# Patient Record
Sex: Female | Born: 1955 | Race: White | Hispanic: No | Marital: Married | Smoking: Current every day smoker
Health system: Southern US, Community
[De-identification: ages and names within clinical notes are randomized; demographics above are authoritative.]

## PROBLEM LIST (undated history)

## (undated) DIAGNOSIS — J449 Chronic obstructive pulmonary disease, unspecified: Secondary | ICD-10-CM

## (undated) DIAGNOSIS — IMO0001 Reserved for inherently not codable concepts without codable children: Secondary | ICD-10-CM

## (undated) DIAGNOSIS — C801 Malignant (primary) neoplasm, unspecified: Secondary | ICD-10-CM

## (undated) DIAGNOSIS — J189 Pneumonia, unspecified organism: Secondary | ICD-10-CM

## (undated) HISTORY — PX: TUBAL LIGATION: SHX77

---

## 2005-09-08 ENCOUNTER — Emergency Department: Payer: Self-pay | Admitting: General Practice

## 2014-04-26 HISTORY — PX: LUNG BIOPSY: SHX232

## 2014-08-21 ENCOUNTER — Ambulatory Visit
Admit: 2014-08-21 | Disposition: A | Discharge status: Home / Self Care | Payer: Self-pay | Attending: Internal Medicine | Admitting: Internal Medicine

## 2014-08-22 ENCOUNTER — Inpatient Hospital Stay
Admission: AD | Admit: 2014-08-22 | Discharge: 2014-08-25 | DRG: 871 | Disposition: A | Discharge status: Home / Self Care | Payer: Self-pay | Attending: Internal Medicine | Admitting: Internal Medicine

## 2014-08-22 DIAGNOSIS — F17200 Nicotine dependence, unspecified, uncomplicated: Secondary | ICD-10-CM | POA: Diagnosis present

## 2014-08-22 DIAGNOSIS — E871 Hypo-osmolality and hyponatremia: Secondary | ICD-10-CM | POA: Diagnosis not present

## 2014-08-22 DIAGNOSIS — Z803 Family history of malignant neoplasm of breast: Secondary | ICD-10-CM

## 2014-08-22 DIAGNOSIS — A419 Sepsis, unspecified organism: Principal | ICD-10-CM | POA: Diagnosis present

## 2014-08-22 DIAGNOSIS — J151 Pneumonia due to Pseudomonas: Secondary | ICD-10-CM | POA: Diagnosis present

## 2014-08-22 DIAGNOSIS — Z886 Allergy status to analgesic agent status: Secondary | ICD-10-CM

## 2014-08-22 DIAGNOSIS — Z87440 Personal history of urinary (tract) infections: Secondary | ICD-10-CM

## 2014-08-22 DIAGNOSIS — E876 Hypokalemia: Secondary | ICD-10-CM | POA: Diagnosis present

## 2014-08-22 DIAGNOSIS — Z885 Allergy status to narcotic agent status: Secondary | ICD-10-CM

## 2014-08-22 DIAGNOSIS — I248 Other forms of acute ischemic heart disease: Secondary | ICD-10-CM | POA: Diagnosis not present

## 2014-08-22 LAB — CBC WITH DIFFERENTIAL/PLATELET
Basophil #: 0 10*3/uL (ref 0.0–0.1)
Basophil %: 0.1 %
Eosinophil #: 0.1 10*3/uL (ref 0.0–0.7)
Eosinophil %: 0.2 %
HCT: 33.4 % — ABNORMAL LOW (ref 35.0–47.0)
HGB: 11.1 g/dL — ABNORMAL LOW (ref 12.0–16.0)
LYMPHS ABS: 1.6 10*3/uL (ref 1.0–3.6)
Lymphocyte %: 6.4 %
MCH: 30 pg (ref 26.0–34.0)
MCHC: 33.3 g/dL (ref 32.0–36.0)
MCV: 90 fL (ref 80–100)
Monocyte #: 2 x10 3/mm — ABNORMAL HIGH (ref 0.2–0.9)
Monocyte %: 7.7 %
NEUTROS ABS: 22.1 10*3/uL — AB (ref 1.4–6.5)
Neutrophil %: 85.6 %
Platelet: 525 10*3/uL — ABNORMAL HIGH (ref 150–440)
RBC: 3.71 10*6/uL — AB (ref 3.80–5.20)
RDW: 14.6 % — ABNORMAL HIGH (ref 11.5–14.5)
WBC: 25.8 10*3/uL — AB (ref 3.6–11.0)

## 2014-08-22 LAB — URINALYSIS, COMPLETE
BACTERIA: NONE SEEN
BILIRUBIN, UR: NEGATIVE
Blood: NEGATIVE
GLUCOSE, UR: NEGATIVE mg/dL (ref 0–75)
Ketone: NEGATIVE
LEUKOCYTE ESTERASE: NEGATIVE
Nitrite: NEGATIVE
Ph: 6 (ref 4.5–8.0)
Protein: NEGATIVE
Specific Gravity: 1.014 (ref 1.003–1.030)

## 2014-08-22 LAB — RAPID HIV SCREEN (HIV 1/2 AB+AG)

## 2014-08-22 LAB — PROTIME-INR
INR: 1.2
PROTHROMBIN TIME: 15.2 s — AB

## 2014-08-22 LAB — BASIC METABOLIC PANEL
Anion Gap: 11 (ref 7–16)
BUN: 25 mg/dL — ABNORMAL HIGH
CALCIUM: 7.8 mg/dL — AB
CO2: 27 mmol/L
Chloride: 94 mmol/L — ABNORMAL LOW
Creatinine: 0.8 mg/dL
EGFR (African American): >60
EGFR (Non-African Amer.): >60
Glucose: 105 mg/dL — ABNORMAL HIGH
Potassium: 3 mmol/L — ABNORMAL LOW
Sodium: 132 mmol/L — ABNORMAL LOW

## 2014-08-22 LAB — HEPATIC FUNCTION PANEL A (ARMC)
ALBUMIN: 2.3 g/dL — AB
ALK PHOS: 294 U/L — AB
ALT: 48 U/L
BILIRUBIN TOTAL: 0.9 mg/dL
Bilirubin, Direct: 0.4 mg/dL
Indirect Bilirubin: 0.5
SGOT(AST): 48 U/L — ABNORMAL HIGH
Total Protein: 6.4 g/dL — ABNORMAL LOW

## 2014-08-22 LAB — PHOSPHORUS: Phosphorus: 3.3 mg/dL

## 2014-08-22 LAB — TROPONIN I
Troponin-I: 0.05 ng/mL — ABNORMAL HIGH
Troponin-I: 0.07 ng/mL — ABNORMAL HIGH
Troponin-I: 0.05 ng/mL — ABNORMAL HIGH

## 2014-08-22 LAB — MAGNESIUM: MAGNESIUM: 2.3 mg/dL

## 2014-08-22 LAB — LACTIC ACID, PLASMA: Lactic Acid, Venous: 1.3 mmol/L

## 2014-08-23 ENCOUNTER — Other Ambulatory Visit: Payer: Self-pay

## 2014-08-23 LAB — CBC WITH DIFFERENTIAL/PLATELET
BASOS ABS: 0.2 10*3/uL — AB (ref 0.0–0.1)
BASOS PCT: 0.7 %
Eosinophil #: 0 10*3/uL (ref 0.0–0.7)
Eosinophil %: 0.1 %
HCT: 32.3 % — ABNORMAL LOW (ref 35.0–47.0)
HGB: 11 g/dL — ABNORMAL LOW (ref 12.0–16.0)
Lymphocyte #: 1.5 10*3/uL (ref 1.0–3.6)
Lymphocyte %: 6.1 %
MCH: 30.6 pg (ref 26.0–34.0)
MCHC: 34.1 g/dL (ref 32.0–36.0)
MCV: 90 fL (ref 80–100)
MONO ABS: 2.2 x10 3/mm — AB (ref 0.2–0.9)
Monocyte %: 9.1 %
NEUTROS PCT: 84 %
Neutrophil #: 20.3 10*3/uL — ABNORMAL HIGH (ref 1.4–6.5)
PLATELETS: 521 10*3/uL — AB (ref 150–440)
RBC: 3.6 10*6/uL — ABNORMAL LOW (ref 3.80–5.20)
RDW: 14.6 % — AB (ref 11.5–14.5)
WBC: 24.2 10*3/uL — AB (ref 3.6–11.0)

## 2014-08-23 LAB — BASIC METABOLIC PANEL
Anion Gap: 11 (ref 7–16)
BUN: 16 mg/dL
CALCIUM: 7.9 mg/dL — AB
CO2: 27 mmol/L
CREATININE: 0.81 mg/dL
Chloride: 95 mmol/L — ABNORMAL LOW
EGFR (African American): >60
EGFR (Non-African Amer.): >60
Glucose: 101 mg/dL — ABNORMAL HIGH
POTASSIUM: 3.6 mmol/L
Sodium: 133 mmol/L — ABNORMAL LOW

## 2014-08-23 LAB — CULTURE, BLOOD (SINGLE)

## 2014-08-24 ENCOUNTER — Other Ambulatory Visit: Payer: Self-pay

## 2014-08-24 DIAGNOSIS — A419 Sepsis, unspecified organism: Secondary | ICD-10-CM | POA: Diagnosis present

## 2014-08-24 LAB — CBC WITH DIFFERENTIAL/PLATELET
BASOS ABS: 0.1 10*3/uL (ref 0.0–0.1)
Basophil %: 0.4 %
EOS ABS: 0.1 10*3/uL (ref 0.0–0.7)
Eosinophil %: 0.2 %
HCT: 28 % — ABNORMAL LOW (ref 35.0–47.0)
HGB: 9.7 g/dL — AB (ref 12.0–16.0)
Lymphocyte #: 1.6 10*3/uL (ref 1.0–3.6)
Lymphocyte %: 6.4 %
MCH: 31.2 pg (ref 26.0–34.0)
MCHC: 34.7 g/dL (ref 32.0–36.0)
MCV: 90 fL (ref 80–100)
MONO ABS: 1.9 x10 3/mm — AB (ref 0.2–0.9)
Monocyte %: 7.7 %
NEUTROS ABS: 20.8 10*3/uL — AB (ref 1.4–6.5)
Neutrophil %: 85.3 %
Platelet: 513 10*3/uL — ABNORMAL HIGH (ref 150–440)
RBC: 3.11 10*6/uL — ABNORMAL LOW (ref 3.80–5.20)
RDW: 14.7 % — AB (ref 11.5–14.5)
WBC: 24.4 10*3/uL — ABNORMAL HIGH (ref 3.6–11.0)

## 2014-08-24 LAB — URINE CULTURE

## 2014-08-24 MED ORDER — SODIUM CHLORIDE 0.9 % IJ SOLN: 3.0000 mL | INTRAMUSCULAR | Status: DC | PRN

## 2014-08-24 MED ORDER — ACETAMINOPHEN 325 MG PO TABS
650.0000 mg | ORAL_TABLET | ORAL | Status: DC | PRN
  Administered 2014-08-25 (×2): 650 mg via ORAL
  Filled 2014-08-24: qty 2

## 2014-08-24 MED ORDER — OXYMETAZOLINE HCL 0.05 % NA SOLN
1.0000 | Freq: Two times a day (BID) | NASAL | Status: DC
  Administered 2014-08-25: 1 via NASAL
  Filled 2014-08-24: qty 15

## 2014-08-24 MED ORDER — IPRATROPIUM-ALBUTEROL 20-100 MCG/ACT IN AERS
1.0000 | INHALATION_SPRAY | Freq: Four times a day (QID) | RESPIRATORY_TRACT | Status: DC
  Administered 2014-08-25 (×2): 1 via RESPIRATORY_TRACT

## 2014-08-24 MED ORDER — VANCOMYCIN HCL IN DEXTROSE 750-5 MG/150ML-% IV SOLN
750.0000 mg | INTRAVENOUS | Status: DC
  Filled 2014-08-24: qty 150

## 2014-08-24 MED ORDER — ONDANSETRON HCL 4 MG/2ML IJ SOLN: 4.0000 mg | INTRAMUSCULAR | Status: DC | PRN

## 2014-08-24 MED ORDER — LEVOFLOXACIN IN D5W 750 MG/150ML IV SOLN: 750.0000 mg | INTRAVENOUS | Status: DC

## 2014-08-24 MED ORDER — DOCUSATE SODIUM 100 MG PO CAPS: 100.0000 mg | ORAL_CAPSULE | Freq: Two times a day (BID) | ORAL | Status: DC | PRN

## 2014-08-24 MED ORDER — SENNA 8.6 MG PO TABS: 1.0000 | ORAL_TABLET | Freq: Two times a day (BID) | ORAL | Status: DC | PRN

## 2014-08-24 MED ORDER — PIPERACILLIN-TAZOBACTAM 4.5 G IVPB
4.5000 g | Freq: Three times a day (TID) | INTRAVENOUS | Status: DC
  Administered 2014-08-25: 06:00:00 4.5 g via INTRAVENOUS
  Filled 2014-08-24 (×3): qty 100

## 2014-08-25 DIAGNOSIS — J151 Pneumonia due to Pseudomonas: Secondary | ICD-10-CM

## 2014-08-25 LAB — CBC WITH DIFFERENTIAL/PLATELET
BASOS PCT: 0 %
Basophils Absolute: 0 10*3/uL (ref 0–0.1)
EOS PCT: 0 %
Eosinophils Absolute: 0 10*3/uL (ref 0–0.7)
HEMATOCRIT: 27.5 % — AB (ref 35.0–47.0)
Hemoglobin: 9.5 g/dL — ABNORMAL LOW (ref 12.0–16.0)
Lymphocytes Relative: 5 %
Lymphs Abs: 1.4 10*3/uL (ref 1.0–3.6)
MCH: 31.1 pg (ref 26.0–34.0)
MCHC: 34.4 g/dL (ref 32.0–36.0)
MCV: 90.5 fL (ref 80.0–100.0)
MONO ABS: 2.9 10*3/uL — AB (ref 0.2–0.9)
Monocytes Relative: 10 %
NEUTROS ABS: 24.4 10*3/uL — AB (ref 1.4–6.5)
Neutrophils Relative %: 85 %
Platelets: 565 10*3/uL — ABNORMAL HIGH (ref 150–440)
RBC: 3.04 MIL/uL — ABNORMAL LOW (ref 3.80–5.20)
RDW: 14.5 % (ref 11.5–14.5)
WBC: 28.7 10*3/uL — ABNORMAL HIGH (ref 3.6–11.0)

## 2014-08-25 LAB — BASIC METABOLIC PANEL
Anion gap: 8 (ref 5–15)
BUN: 14 mg/dL (ref 6–20)
CALCIUM: 7.7 mg/dL — AB (ref 8.9–10.3)
CO2: 25 mmol/L (ref 22–32)
Chloride: 97 mmol/L — ABNORMAL LOW (ref 101–111)
Creatinine, Ser: 0.77 mg/dL (ref 0.44–1.00)
GFR calc Af Amer: >60 mL/min (ref >60)
GFR calc non Af Amer: >60 mL/min (ref >60)
GLUCOSE: 106 mg/dL — AB (ref 65–99)
Potassium: 4.1 mmol/L (ref 3.5–5.1)
SODIUM: 130 mmol/L — AB (ref 135–145)

## 2014-08-25 LAB — EXPECTORATED SPUTUM ASSESSMENT W REFEX TO RESP CULTURE

## 2014-08-25 MED ORDER — LEVOFLOXACIN 500 MG PO TABS
750.0000 mg | ORAL_TABLET | Freq: Every day | ORAL | Status: AC
Start: 2014-08-25 — End: 2014-09-04

## 2014-08-25 NOTE — Consult Note (Addendum)
PATIENT NAME:  Kelly Curry, Kelly Curry MR#:  [id number] DATE OF BIRTH:  09-05-1955  DATE OF CONSULTATION:  08/23/2014  REFERRING PHYSICIAN:   CONSULTING PHYSICIAN:  Allyne Gee, MD  REASON FOR CONSULTATION: Cavitary pneumonia.   HISTORY OF PRESENT ILLNESS: A 59 year old female who is a patient of Dr. Clayborn Bigness, came into the office about a week or so ago when she developed increasing cough and congestion. She was having fevers and chills, thought to have influenza. She did not seek attention until Monday of this week. She had been having fevers about every 4-6 hours, took  Tylenol to help. Then she started having a cough, was not really producing enough sputum. She had her urine checked and that appeared to show that there was a UTI and she was started on antibiotics. The patient states that she has not traveled abroad. She never leaves [place]. She states that she has been a smoker and has smoked about a pack and a half day. The patient has had no previous history of cancer.   PAST MEDICAL HISTORY: Unremarkable.   PAST SURGICAL HISTORY: Unremarkable.   SOCIAL HISTORY: As already mentioned, positive for tobacco use, a pack and a half day. She does also drink alcohol. She works at [employer].    ALLERGIES: HYDROCODONE, MOTRIN, CODEINE AND ADVIL.   MEDICATIONS: Reviewed on the electronic medical record.   REVIEW OF SYSTEMS: Complete 12-point review of system is performed and was unremarkable other than the HPI.   PHYSICAL EXAMINATION: VITAL SIGNS: At the time the patient was seen, temperature 97.9, pulse 95, respiratory rate 18, blood pressure 95/61, saturations 95%.  NECK: Appeared to be supple. There was no JVD. No adenopathy. No thyromegaly.  CHEST: Coarse breath sounds but diminished.  CARDIOVASCULAR: S1, S2 normal. Regular rhythm. No gallop or rub.  ABDOMEN: Soft and nontender.  NEUROLOGIC: The patient was awake and alert, moving all 4 extremities.  ABDOMEN: Soft and appeared to be  benign.  EXTREMITIES: No cyanosis or clubbing. Pulses equal.  SKIN: Without any acute rashes.  MUSCULOSKELETAL: Without any active synovitis.   LABORATORY RESULTS: White count 24.2, hemoglobin 11, hematocrit 32.3. CT scan of the chest shows extensive left upper lobe cavitary pneumonitis with some volume loss. There is also a dense calcified left lower lobe granuloma. There was no centrally obstructing mass noted. The patient's blood gases done on admission were 7.5 pO2, 59.  IMPRESSION: 1.  Cavitary left upper lobe pneumonia.  2.  History of tobacco abuse, probable chronic obstructive pulmonary disease.   PLAN: Currently, she is on antibiotics and I would continue with the current selection as you are. Would also collect sputum x 3 for AFB and also cultures. She is currently going to continue with Zosyn and she also did receive vancomycin. If the x-ray over the course of the next several days does not show any significant improvement, then I would consider doing a bronchoscopy. Risks and benefits were discussed with the patient at this time. Would continue with pulmonary toilet as you are. Smoking cessation counseling also provided.   Thank you for consulting me in the care of this patient. Will make further recommendations as necessary.    ____________________________ Allyne Gee, MD sak:at D: 08/23/2014 12:11:19 ET T: 08/23/2014 12:30:08 ET JOB#: [id number]  cc: Allyne Gee, MD, <Dictator> Allyne Gee MD ELECTRONICALLY SIGNED 09/19/2014 20:36

## 2014-08-25 NOTE — H&P (Signed)
PATIENT NAME:  Kelly Curry, Kelly Curry MR#:  [id number] DATE OF BIRTH:  1955/12/04  DATE OF ADMISSION:  08/22/2014  ADMITTING PHYSICIAN:  Gladstone Lighter, MD PRIMARY CARE PHYSICIAN:  Lavera Guise, MD   CHIEF COMPLAINT:  Flu-like illness.   HISTORY OF PRESENT ILLNESS:  Ms. Wynetta Emery is a 58 year old Caucasian female with no significant past medical history.  Went to Dr. Etta Quill office this Monday for a week history of chills, fevers at home, worsening breathing and also body aches.  She never had a primary care physician before this.  She said her symptoms started about 9 days ago.  She thought it felt like flu and she would get over it; however, they were not improving.  Initially with fevers, chills, chills every 4 to 6 hours and she has been taking Extra Strength Tylenol every 4 to 6 hours.   She was getting body aches and slowly developed into labored breathing with slight dry cough.  Dr. Humphrey Rolls did some blood work on her on Tuesday and then she was asked today to come to the ER because her labs were abnormal.   She had an elevated white count of 22,000 and a possible UTI.  The patient denies any previous exposure to tuberculosis.  She has never worked in a prison or nursing home.  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  No chemical exposure.  Never was diagnosed with any COPD, asthma and never been hospitalized before.  Her chest x-ray here shows a large cavitary lesion, possible pneumonia versus underlying tuberculosis on the left upper lobe and is being admitted for the same.   PAST MEDICAL HISTORY:  No known past medical history.   PAST SURGICAL HISTORY:  Tubal ligation.   SOCIAL HISTORY:  Smokes about 1-1/2 packs per day.  Alcohol use, up to 1 to 2 beers every day.  No recreational drug use.  Steady on her feet.  Works at the [employer].   FAMILY HISTORY:  Mom with breast cancer and dad with COPD.    ALLERGIES TO MEDICATIONS:  CODEINE, HYDROCODONE, MOTRIN AND  ADVIL ALL HER NAUSEA AND VOMITING.   CURRENT HOME MEDICATIONS:  She was not taking any medications prior to seeing Dr. Humphrey Rolls but on Monday, she was started on Levaquin 500 mg p.o. daily.   REVIEW OF SYSTEMS:   CONSTITUTIONAL:  Positive for fever, fatigue, and weakness.  EYES:  No blurred vision, double vision, inflammation or glaucoma.  ENT:  No tinnitus, ear pain, hearing loss, epistaxis, or discharge.   RESPIRATORY:  Positive for minimal cough and dyspnea.  No COPD or hemoptysis.   CARDIOVASCULAR:  No chest pain, orthopnea, edema, arrhythmia, palpitations, or syncope.  GASTROINTESTINAL:  Positive for nausea, vomiting.  No abdominal pain, hematemesis, or melena.  GENITOURINARY:  No dysuria, hematuria, renal calculus, frequency, or incontinence.  ENDOCRINE:  No polyuria, nocturia, thyroid problems, heat or cold intolerance.  HEMATOLOGY:  No anemia, easy bruising or bleeding.  SKIN:  No acne, rash or lesions.  MUSCULOSKELETAL:  No neck, back, shoulder pain, arthritis or gout.  NEUROLOGICAL:  No numbness, weakness, CVA, TIA or seizures.  PSYCHOLOGICAL:  No anxiety, insomnia or depression.   PHYSICAL EXAMINATION:  VITAL SIGNS:  Temperature 98.2 degrees Fahrenheit, pulse 99, respirations 18, blood pressure 142/68, pulse oximetry 98% on room air.  GENERAL:  A thin-built well-nourished female sitting in bed not in any acute distress.  HEENT:  Normocephalic, atraumatic.  Pupils equal, round, reacting to light.  Anicteric  sclerae. Extraocular movements intact.  Oropharynx is clear without erythema, mass, or exudates.  NECK:  Supple. No thyromegaly, JVD or carotid bruits.  No lymphadenopathy.  LUNGS:  Moving air bilaterally.  Decreased left upper lobe breath sounds posteriorly.  No wheeze.  No rhonchi.  No crackles heard.  No use of accessory muscles for breathing.  CARDIOVASCULAR:  S1, S2, regular rate and rhythm.  No murmurs, rubs, or gallops.  ABDOMEN:  Soft, nontender, nondistended.  No  hepatosplenomegaly.  Normal bowel sounds.  EXTREMITIES:  No pedal edema.  No clubbing or cyanosis, 2+ dorsalis pedis pulses palpable bilaterally.  SKIN:  No acne, rash or lesions.  LYMPHATICS:  No cervical lymphadenopathy.  NEUROLOGIC:  Cranial nerves intact.  No focal motor or sensory deficits.  PSYCHOLOGICAL:  The patient is awake, alert, oriented x3.   LABORATORY DATA:  WBC 25.8, hemoglobin 11.1, hematocrit 33.4, platelet count 525,000. Sodium 132, potassium 3.0, chloride 94, bicarbonate 27, BUN 25, creatinine 0.80, glucose 105, and calcium of 7.8.  Troponin is 0.07.  Urinalysis - negative for any infection. Magnesium 2.3, phosphorus is 3.3.  INR is 1.2  ALT 48, AST 48, alkaline phosphatase 294, total bilirubin 0.4, albumin of 3.3.  Chest x-ray in the ER showing persistent left upper lobe pneumonia, heart size and pulmonary vascularity are normal.  No adenopathy noted.  CT of the chest done with contrast showing extensive left upper lobe cavitary pneumonia with associated volume loss, densely calcified left lower lobe granuloma, could be reactivation tuberculosis or bacterial pneumonia.   No evidence of any centrally obstructing mass lesion to suggest neoplasm noted.  EKG showing normal sinus rhythm.  No acute ST-T wave abnormalities.  Heart rate of 100.    ASSESSMENT AND PLAN:  This is a 59 year old female with no known past medical history other than smoking, went to her primary care physician for a weeklong cough, fevers and chills at home and now chest x-ray noted to have left upper lobe cavitary lesion.  1.  Sepsis, possibly secondary to pneumonia, mild sinus tachycardia with heart rate greater than 100 and also elevated WBC of 25.8.  Blood cultures have been ordered.  Based on the appearance of CT chest, tuberculosis cannot be ruled out though the patient does not have any exposure to tuberculosis.  Will place her on airborne isolation, sputum for acid-fast bacilli x 3, pulmonary consult, start  her on broad-spectrum antibiotics with vancomycin, Zosyn and Levaquin.  Blood cultures have already been ordered and pulmonary has been consulted at this time.  2.  Hypokalemia, being replaced.  3.  Tobacco use disorder.  Counseled against smoking for greater than 3 minutes and the patient refused to be on any nicotine patch at this time.  No known diagnosis of chronic obstructive pulmonary disease.  Will start on Combivent Respimat just for bronchodilatation purposes.    TOTAL TIME SPENT ON ADMISSION:  50 minutes      ____________________________ Gladstone Lighter, MD rk:NT D: 08/22/2014 17:28:19 ET T: 08/22/2014 17:57:58 ET JOB#: [id number]  cc: Gladstone Lighter, MD, <Dictator> Lavera Guise, MD Gladstone Lighter MD ELECTRONICALLY SIGNED 08/23/2014 17:22

## 2014-08-25 NOTE — Plan of Care (Signed)
Problem: Discharge Progression Outcomes Goal: Other Discharge Outcomes/Goals Plan of Care Progress to Goal:  Infection:  IV abx continue. Pt remains afebrile. Airborne isolation continues. Still awaiting last sputum specimen from pt. Specimen cup in room.

## 2014-08-25 NOTE — Plan of Care (Signed)
Problem: Discharge Progression Outcomes Goal: Discharge plan in place and appropriate Individualization of Care Patient goes by [facility].  Lives at home with husband.  Smokes 1.5 ppd. denies other PMH.

## 2014-08-25 NOTE — Progress Notes (Signed)
For prior documentation see [facility] clinical [facility] medical record # [id number]

## 2014-08-25 NOTE — Discharge Summary (Signed)
Kelly Curry, is a 59 y.o. female  DOB 05-23-1955  MRN [id number].  Admission date:  08/22/2014  Admitting Physician  Gladstone Lighter, MD  Discharge Date:  08/25/2014   Primary MD  No primary care provider on file.    Admission Diagnosis  Sepsis   Discharge Diagnosis  Sepsis    Active Problems:   Sepsis   Pneumonia due to Pseudomonas      No past medical history on file.  No past surgical history on file.     History of present illness and  Hospital Course:     Kindly see H&P for history of present illness and admission details, please review complete Labs, Consult reports and Test reports for all details in brief  HPI  from the history and physical done on the day of admission    Hospital Course    1 extensive LUL cavitary lesion: we are r/o TB but could be possibly bacterial PNA She was on isolation and we were able to obtain AFB times 3 ID and pulm consult appreciated Her sputum cx grew out PSEUDOMONAS She will be discharged with Levaquin she was discharged with ABX for 15 days total but may need one more week. She will have follow up with Pulmonary within one week. I did contact the Health Dept to notify them. They will be in contact with patient regarding her AFB results.  2. Elevated WBC from above issues She will need repeat CBC as her WBC are still elevated,but she was afebrile. She was desperate to go home and since she was afebrile and her sputum culture results with sensitivities came back she will be discharged with close follow up. Her blood cx were negative  3. elevated trooponin demand ischemia not ACS  4. hypoK repleted and improved 5. Hyponatremia: from cavitary lesion liklely Na has remained stable but low Due to her tobacco dependence she will need repeat CT scan in 4  weeks   Discharge Condition: stable   Follow UP  Follow-up Information    Follow up with [facility] A, MD. Schedule an appointment as soon as possible for a visit in 1 week.   Specialty:  Internal Medicine   Contact information:   [place] [place] [id number] [phone]         Discharge Instructions  and  Discharge Medications     Discharge Instructions    Call MD for:  temperature >100.4    Complete by:  As directed      Discharge instructions    Complete by:  As directed   Do not return to work until follow up with PULMONARY Resume Regular Diet            Medication List    TAKE these medications        levofloxacin 500 MG tablet  Commonly known as:  LEVAQUIN  Take 1.5 tablets (750 mg total) by mouth daily.          Diet and Activity recommendation: See Discharge Instructions above  Consults obtained - PULMONARY ID   Major procedures and Radiology Reports - PLEASE review detailed and final reports for all details, in brief -      Dg Chest 2 View  08/21/2014   CLINICAL DATA:  Two day history of bladder infection symptoms, possible pneumonia, fever chills for the past week, history of tobacco use.  EXAM: CHEST  2 VIEW  COMPARISON:  None.  FINDINGS: There is a patchy interstitial -cavitary pattern in the left upper lobe. The left lower lobe and the right lung are clear. There is an 31m calcified nodule in the superior segment of the left lower lobe posteriorly.  The heart and pulmonary vascularity are normal. The mediastinum is normal in width. The trachea is midline. There is no pleural effusion. The observed bony thorax is unremarkable.  IMPRESSION: Abnormal appearance of the left upper lobe worrisome for pneumonia, either bacterial or mycobacterial. Malignancy is felt less likely but is not excluded. There is evidence of previous granulomatous infection and thus the findings could indicate reactivation of tuberculosis.  Sputum analysis and  chest CT scanning are recommended.  These results will be called to the ordering clinician or representative by the Radiologist Assistant, and communication documented in the PACS or zVision Dashboard.   Electronically Signed   By: David  JMartiniqueM.D.   On: 08/21/2014 15:39   Ct Chest W Contrast  08/22/2014   CLINICAL DATA:  Nonproductive cough and weakness. Diagnosed with pneumonia yesterday, on antibiotic therapy. Initial encounter.  EXAM: CT CHEST WITH CONTRAST  TECHNIQUE: Multidetector CT imaging of the chest was performed during intravenous contrast administration.  CONTRAST:  75 ml Omnipaque 300.  COMPARISON:  Chest radiographs 08/21/2014 and 08/22/2014.  FINDINGS: Mediastinum/Nodes: There are no enlarged mediastinal, hilar or axillary lymph nodes. Small mediastinal and left hilar lymph nodes are likely reactive. The trachea, esophagus and thyroid gland demonstrate no significant findings. The heart size is normal. There is no pericardial effusion.There is minimal atherosclerosis of the aorta and great vessels.  Lungs/Pleura: There is no pleural effusion. As demonstrated on recent radiographs, there is extensive cavitary airspace disease in the left upper lobe with multiple air-fluid levels measuring up to 3 cm on image 10. There is associated left upper lobe volume loss. No well-defined mass or endobronchial lesion is seen. There is no other airspace disease. There is a densely calcified granuloma in the left lower lobe. The right lung is clear. Moderate emphysematous changes are present.  Upper abdomen:  Unremarkable.  There is no adrenal mass.  Musculoskeletal/Chest wall: There is no chest wall mass or suspicious osseous finding.  IMPRESSION: 1. Extensive left upper lobe cavitary pneumonia with associated volume loss as demonstrated on recent radiographs. There is a densely calcified left lower lobe granuloma. As previously noted, this could reflect reactivation tuberculosis or bacterial pneumonia. 2. No  CT evidence of centrally obstructing mass lesion to suggest neoplasm. Followup PA and lateral chest X-ray is recommended in 3-4 weeks following trial of antibiotic therapy to ensure resolution. 3. Underlying emphysema.   Electronically Signed   By: WRichardean SaleM.D.   On: 08/22/2014 17:15   Dg Chest Port 1 View  08/22/2014   CLINICAL DATA:  Pneumonia with productive cough  EXAM: PORTABLE CHEST - 1 VIEW  COMPARISON:  August 21, 2014  FINDINGS: There is persistent left upper lobe pneumonia with both interstitial and alveolar components in this region. Lungs elsewhere clear. Heart size and pulmonary vascularity are normal. No adenopathy. No bone lesions.  IMPRESSION: Persistent left upper lobe pneumonia. Followup PA and lateral chest X radiographs recommended in 3-4 weeks following trial of antibiotic therapy to ensure resolution and exclude underlying malignancy.   Electronically Signed   By: Lowella Grip III M.D.   On: 08/22/2014 16:40    Micro Results     Recent Results (from the past 240 hour(s))  Urine culture     Status: None   Collection Time: 08/22/14  1:11 PM  Result Value Ref Range Status   Micro Text Report   Final       SOURCE: CLEAN CATCH    COMMENT                   NO GROWTH IN 36 HOURS   ANTIBIOTIC                                                      Culture, blood (single)     Status: None (Preliminary result)   Collection Time: 08/22/14  4:40 PM  Result Value Ref Range Status   Micro Text Report   Preliminary       COMMENT                   NO GROWTH IN 18-24 HOURS   ANTIBIOTIC                                                      Culture, blood (single)     Status: None (Preliminary result)   Collection Time: 08/22/14  4:40 PM  Result Value Ref Range Status   Micro Text Report   Preliminary       COMMENT                   NO GROWTH IN 18-24 HOURS   ANTIBIOTIC                                                      Culture, expectorated sputum-assessment      Status: None   Collection Time: 08/23/14  3:20 AM  Result Value Ref Range Status   Micro Text Report   Final       ORGANISM 1                LIGHT GROWTH PSEUDOMONAS AERUGINOSA   GRAM STAIN                MODERATE WHITE BLOOD CELLS   GRAM STAIN                FEW GRAM NEGATIVE ROD   GRAM STAIN                RARE GRAM POSITIVE COCCI   GRAM STAIN                GOOD SPECIMEN-80-90% WBC   ANTIBIOTIC                    ORG#1  CEFTAZIDIME                   S         CIPROFLOXACIN                 S         GENTAMICIN                    S         IMIPENEM                      S         LEVOFLOXACIN                  S                Today   Subjective:   Kelly Curry today is doing well. She wants to go home. She denies cough or fevers. She denies chest pain Objective:   Blood pressure 131/77, temperature 99.2 F (37.3 C), temperature source Oral, resp. rate 16, height 4' 10.98" (1.498 m), weight 42.14 kg (92 lb 14.4 oz).   Intake/Output Summary (Last 24 hours) at 08/25/14 1313 Last data filed at 08/25/14 7253  Gross per 24 hour  Intake    100 ml  Output      0 ml  Net    100 ml    Exam Awake Alert, Oriented x 3, No new F.N deficits, Normal affect .AT,PERRAL Supple Neck,No JVD, No cervical lymphadenopathy appriciated.  Symmetrical Chest wall movement, Good air movement bilaterally, CTAB RRR,No Gallops,Rubs or new Murmurs, No Parasternal Heave +ve B.Sounds, Abd Soft, Non tender, No organomegaly appriciated, No rebound -guarding or rigidity. No Cyanosis, Clubbing or edema, No new Rash or bruise  Data Review   CBC w Diff: Lab Results  Component Value Date   WBC 28.7* 08/25/2014   WBC 24.4* 08/24/2014   HGB 9.5* 08/25/2014   HGB 9.7* 08/24/2014   HCT 27.5* 08/25/2014   HCT 28.0* 08/24/2014   PLT 565* 08/25/2014   PLT 513* 08/24/2014   LYMPHOPCT 5 08/25/2014   LYMPHOPCT 6.4 08/24/2014   MONOPCT 10 08/25/2014   MONOPCT 7.7 08/24/2014   EOSPCT 0  08/25/2014   EOSPCT 0.2 08/24/2014   BASOPCT 0 08/25/2014   BASOPCT 0.4 08/24/2014    CMP: Lab Results  Component Value Date   NA 130* 08/25/2014   K 4.1 08/25/2014   CL 97* 08/25/2014   CO2 25 08/25/2014   CO2 27 08/23/2014   BUN 14 08/25/2014   BUN 16 08/23/2014   CREATININE 0.77 08/25/2014   CREATININE 0.81 08/23/2014   PROT 6.4* 08/22/2014   ALBUMIN 2.3* 08/22/2014   ALKPHOS 294* 08/22/2014   AST 48* 08/22/2014   ALT 48 08/22/2014  .   Total Time in preparing paper work, data evaluation and todays exam - 35 minutes

## 2014-08-25 NOTE — Consult Note (Addendum)
PATIENT NAME:  Kelly Curry, Kelly Curry MR#:  [id number] DATE OF BIRTH:  [birth year]  DATE OF CONSULTATION:  08/23/2014  REFERRING PHYSICIAN:  Dr. Benjie Karvonen CONSULTING PHYSICIAN:  Cheral Marker. Ola Spurr, MD  REASON FOR CONSULTATION: Cavitary pneumonia.   HISTORY OF PRESENT ILLNESS: A 59 year old female with no significant history except for heavy tobacco abuse who was admitted April 28th with fevers, cough, chills, leukocytosis, and a chest x-ray and CT showing upper lobe cavitary lesions. She really has no significant history, has not seen a doctor in years. She reports this came on about 1 week ago when she started developing chills after coming home from work. She did have some cough. She was eventually seen and had a white count as an outpatient in the 20,000s. She was referred for a CT scan and referred for admission.   The patient reports that in March of this year she did go to a walk-in clinic for some sinus symptoms as well as some mild cough, but was not given any antibiotics had any further work-up done. She was told it was likely allergies. Since then she has not been losing any weight. She does have a chronic cough though which she attributes to smoking. She has no history of TB exposure. She has never been out of the country. He has never been in the [employer] or prison.  PAST MEDICAL HISTORY: None, except for heavy tobacco abuse.   PAST SURGICAL HISTORY: Tubal ligation.   SOCIAL HISTORY: Smokes 1-1/2 packs a day. Drinks 1 to 2 beers a day. No IV drug use. Married and lives with her husband. Works at [employer].   FAMILY HISTORY: Positive for breast cancer in her mother and COPD in her father.   ALLERGIES: CODEINE, HYDROCODONE WITH MOTRIN AND ADVIL, all of which caused nausea and vomiting.   ANTIBIOTICS SINCE ADMISSION: Include Zosyn, vancomycin and azithromycin. As an outpatient, she was found levofloxacin.  REVIEW OF SYSTEMS: Eleven systems reviewed and negative except as per  HPI.  PHYSICAL EXAMINATION: VITAL SIGNS: Temperature 97.9, pulse is 95, blood pressure 95/61, respirations 18, sat 95% on room air, T-max since admission 100.7.  GENERAL: She is quite thin. She is lying in bed in no acute distress, somewhat a poor historian.  HEENT: Pupils reactive. Sclerae are anicteric. Oropharynx is clear with no thrush. She is edentulous.  NECK: Supple. There is no anterior cervical, posterior cervical or supraclavicular lymphadenopathy.  HEART: Regular.  LUNGS: Have very decreased breath sounds bilaterally.  ABDOMEN: Soft, nontender, nondistended.  EXTREMITIES: No clubbing, cyanosis or edema.  SKIN: No lesions.  NEUROLOGIC: She is alert and oriented x3. Grossly nonfocal neuro exam.   DIAGNOSTIC DATA: Laboratory: White blood count April 28th was 25.8, currently 24.2. Hemoglobin 11, platelets 521,000. Diff shows 20.3 neutrophils. INR was normal. Urinalysis showed 5 white cells.   Urine culture negative. Blood cultures negative. Sputum culture April 29th is pending, but gram stain showed moderate white cells, a few gram-negative rods, a few gram-positive cocci.   Renal function is normal; creatinine 0.81.  LFTs had a low albumin at 2.3, alk phos 294, AST 48.   Radiology: Chest x-ray April 27th showed abnormal appearance of the left upper lobe worrisome for pneumonia. There is evidence of previous granulomatous disease.   CT of the chest April 28th showed extensive left upper lobe territory pneumonia with associated volume loss. This is demonstrated on recent radiographs. There is also a calcified granuloma.   Serology: HIV antibody testing is negative.   IMPRESSION: A  59 year old female admitted with approximately 1 week of illness with cough, fevers, chills and leukocytosis. She does have an impressive left upper lobe infiltrate, which is cavitary in nature. She has some granulomas as well. She denies any known tuberculosis risk factors or prior history of tuberculosis.  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 Sputum cultures seem to be mixed with some gram positives and gram negatives.   With the rather sudden onset of this I do suspect it is likely a bacterial acute process; however, she certainly needs to be ruled out for tuberculosis. I do not get the sense that she has had a prolonged illness with weight loss, fevers or night sweats. She does have a chronic cough which she attributes to smoking.   RECOMMENDATIONS: 1.  Continue current antibiotics.  2.  Based on sputum culture can tailor antibiotics once results are available.  3.  Agree with 3 AFB.  4.  Check QuantiFERON Gold.   Thank you for the consult. I will be glad to follow with you.   ____________________________ Cheral Marker. Ola Spurr, MD dpf:sb D: 08/23/2014 14:35:56 ET T: 08/23/2014 15:17:36 ET JOB#: [id number]  cc: Cheral Marker. Ola Spurr, MD, <Dictator> Mackay Hanauer Ola Spurr MD ELECTRONICALLY SIGNED 08/27/2014 10:25

## 2014-08-25 NOTE — Progress Notes (Signed)
Patient discharged per MD order. IV removed and in tact. Prescriptions given to patient. All discharge instructions given and all questions answered. Escorted by nursing and husband.

## 2014-08-25 NOTE — Discharge Instructions (Signed)
Please stay in isolation until we have all three AFB results. The health department will be in contact with you for these results.

## 2014-09-05 ENCOUNTER — Other Ambulatory Visit: Payer: Self-pay | Admitting: Internal Medicine

## 2014-09-05 DIAGNOSIS — J984 Other disorders of lung: Secondary | ICD-10-CM

## 2014-09-06 ENCOUNTER — Ambulatory Visit
Admission: RE | Admit: 2014-09-06 | Discharge: 2014-09-06 | Disposition: A | Discharge status: Home / Self Care | Payer: Self-pay | Source: Ambulatory Visit | Attending: Internal Medicine | Admitting: Internal Medicine

## 2014-09-06 DIAGNOSIS — J479 Bronchiectasis, uncomplicated: Secondary | ICD-10-CM | POA: Insufficient documentation

## 2014-09-06 DIAGNOSIS — J189 Pneumonia, unspecified organism: Secondary | ICD-10-CM | POA: Insufficient documentation

## 2014-09-06 DIAGNOSIS — J984 Other disorders of lung: Secondary | ICD-10-CM

## 2014-09-17 ENCOUNTER — Ambulatory Visit
Admission: RE | Admit: 2014-09-17 | Discharge: 2014-09-17 | Disposition: A | Discharge status: Home / Self Care | Payer: Self-pay | Source: Ambulatory Visit | Attending: Internal Medicine | Admitting: Internal Medicine

## 2014-09-17 ENCOUNTER — Ambulatory Visit: Payer: Self-pay

## 2014-09-17 ENCOUNTER — Encounter
Admission: RE | Disposition: A | Discharge status: Home / Self Care | Payer: Self-pay | Source: Ambulatory Visit | Attending: Internal Medicine

## 2014-09-17 ENCOUNTER — Encounter: Payer: Self-pay | Admitting: *Deleted

## 2014-09-17 DIAGNOSIS — Z7951 Long term (current) use of inhaled steroids: Secondary | ICD-10-CM | POA: Insufficient documentation

## 2014-09-17 DIAGNOSIS — Z9889 Other specified postprocedural states: Secondary | ICD-10-CM

## 2014-09-17 DIAGNOSIS — Z886 Allergy status to analgesic agent status: Secondary | ICD-10-CM | POA: Insufficient documentation

## 2014-09-17 DIAGNOSIS — J984 Other disorders of lung: Secondary | ICD-10-CM

## 2014-09-17 DIAGNOSIS — J42 Unspecified chronic bronchitis: Secondary | ICD-10-CM | POA: Insufficient documentation

## 2014-09-17 DIAGNOSIS — Z885 Allergy status to narcotic agent status: Secondary | ICD-10-CM | POA: Insufficient documentation

## 2014-09-17 DIAGNOSIS — J9811 Atelectasis: Secondary | ICD-10-CM | POA: Insufficient documentation

## 2014-09-17 DIAGNOSIS — F17200 Nicotine dependence, unspecified, uncomplicated: Secondary | ICD-10-CM | POA: Insufficient documentation

## 2014-09-17 HISTORY — DX: Malignant (primary) neoplasm, unspecified: C80.1

## 2014-09-17 HISTORY — DX: Pneumonia, unspecified organism: J18.9

## 2014-09-17 HISTORY — DX: Reserved for inherently not codable concepts without codable children: IMO0001

## 2014-09-17 HISTORY — DX: Chronic obstructive pulmonary disease, unspecified: J44.9

## 2014-09-17 SURGERY — BRONCHOSCOPY, FLEXIBLE
Anesthesia: Moderate Sedation

## 2014-09-17 MED ORDER — SODIUM CHLORIDE 0.9 % IV SOLN
INTRAVENOUS | Status: DC
  Administered 2014-09-17: 12:00:00 via INTRAVENOUS

## 2014-09-17 MED ORDER — MIDAZOLAM HCL 5 MG/5ML IJ SOLN
INTRAMUSCULAR | Status: AC
  Administered 2014-09-17: 3 mg
  Filled 2014-09-17: qty 10

## 2014-09-17 MED ORDER — FENTANYL CITRATE (PF) 100 MCG/2ML IJ SOLN
INTRAMUSCULAR | Status: AC
Start: 2014-09-17 — End: 2014-09-17
  Administered 2014-09-17: 12:00:00
  Administered 2014-09-17: 100 ug
  Filled 2014-09-17: qty 4

## 2014-09-17 MED ORDER — LIDOCAINE HCL 2 % EX GEL
1.0000 "application " | Freq: Once | CUTANEOUS | Status: DC
  Filled 2014-09-17: qty 5

## 2014-09-17 NOTE — Discharge Instructions (Signed)
Flexible Bronchoscopy, Care After °These instructions give you information on caring for yourself after your procedure. Your doctor may also give you more specific instructions. Call your doctor if you have any problems or questions after your procedure. °HOME CARE °· Do not eat or drink anything for 2 hours after your procedure. If you try to eat or drink before the medicine wears off, food or drink could go into your lungs. You could also burn yourself. °· After 2 hours have passed and when you can cough and gag normally, you may eat soft food and drink liquids slowly. °· The day after the test, you may eat your normal diet. °· You may do your normal activities. °· Keep all doctor visits. °GET HELP RIGHT AWAY IF: °· You get more and more short of breath. °· You get light-headed. °· You feel like you are going to pass out (faint). °· You have chest pain. °· You have new problems that worry you. °· You cough up more than a little blood. °· You cough up more blood than before. °MAKE SURE YOU: °· Understand these instructions. °· Will watch your condition. °· Will get help right away if you are not doing well or get worse. °Document Released: 02/07/2009 Document Revised: 04/17/2013 Document Reviewed: 12/15/2012 °ExitCare® Patient Information ©2015 [employer]. This information is not intended to replace advice given to you by your health care provider. Make sure you discuss any questions you have with your health care provider. ° °

## 2014-09-17 NOTE — Op Note (Signed)
[facility] Patient Name: Kelly Curry Procedure Date: 09/17/2014 11:17 AM MRN: [id number] Account #: [id number] Date of Birth: [birth year] Admit Type: Outpatient Age: 59 Room: [facility] PROCEDURE RM 01 Gender: Female Note Status: Finalized Attending MD: Allyne Gee, MD Procedure:         Bronchoscopy Indications:       Atelectasis of the left upper lobe Providers:         Allyne Gee, MD Referring MD:       Medicines:         Midazolam 3 mg mg IV, Fentanyl 448 mcg IV Complications:     No immediate complications Procedure:         Pre-Anesthesia Assessment:                    - A History and Physical has been performed. The patient's                     medications, allergies and sensitivities have been                     reviewed.                    - The risks and benefits of the procedure and the sedation                     options and risks were discussed with the patient. All                     questions were answered and informed consent was obtained.                    - Pre-procedure physical examination revealed no                     contraindications to sedation.                    - ASA Grade Assessment: II - A patient with mild systemic                     disease.                    - After reviewing the risks and benefits, the patient was                     deemed in satisfactory condition to undergo the procedure.                    - The anesthesia plan was to use moderate                     sedation/analgesia.                    - Immediately prior to administration of medications, the                     patient was re-assessed for adequacy to receive sedatives.                    - The heart rate, respiratory rate, oxygen saturations,                     blood pressure, adequacy of pulmonary ventilation, and  response to care were monitored throughout the procedure.                    - The physical status of the  patient was re-assessed after                     the procedure.                    After obtaining informed consent, the bronchoscope was                     passed under direct vision. Throughout the procedure, the                     patient's blood pressure, pulse, and oxygen saturations                     were monitored continuously. the Bronchoscope Olympus                     [id number] was introduced through the right                     nostril and advanced to the tracheobronchial tree of both                     lungs. The procedure was accomplished without difficulty.                     The patient tolerated the procedure well. The total                     duration of the procedure was 20 minutes. Findings:      The nasopharynx/oropharynx appears normal. The larynx appears normal.       The vocal cords are normal and move normally with phonation and       breathing. The subglottic space is normal. The trachea is of normal       caliber. The carina is sharp. The tracheobronchial tree of the right       lung was examined to at least the first subsegmental level. Bronchial       mucosa and anatomy in the right lung are normal; there are no       endobronchial lesions, and no secretions.      Left Lung Abnormalities: Scant secretions were found in the left upper       lobe. They were not obstructing the airway. Erythema was found in the       left upper lobe.      Washings were obtained in the left upper lobe of the lung and sent for       cell count, bacterial culture, viral smears & culture, and fungal & AFB       analysis and cytology. The return was clear.      Verification of patient identification for the specimen was done by the       physician and nurse using the patient's name, birth date and medical       record number.      Transbronchial biopsies were performed in the LUL apical posterior       segments (B1 & B2) of the lung using forceps and sent for  histopathology       examination. The procedure was guided by fluoroscopy. Three biopsy  passes were performed. Three biopsy samples were obtained. Impression:        - The right lung was normal.                    - Scant secretions were found in the left upper lobe.                    - Erythema was present in the left upper lobe.                    - Washings were obtained.                    - Transbronchial lung biopsies were performed. Recommendation:    - Await biopsy, culture, cytology and washing results. Devona Konig, MD Allyne Gee, MD 09/17/2014 12:38:39 PM This report has been signed electronically. Number of Addenda: 0 Note Initiated On: 09/17/2014 11:17 AM      [facility]

## 2014-09-18 LAB — SURGICAL PATHOLOGY

## 2014-09-19 NOTE — H&P (Signed)
Pulmonary Critical Care Medicine    Kelly Curry WNU:[id number] DOB: 1955/11/06 DOA: 09/17/2014  Referring physician: Clayborn Bigness, MD PCP: Lavera Guise, MD   Chief Complaint: Bronchoscopy  HPI: Kelly Curry is a 59 y.o. female with recent admission for cavitary pneumonia presents for a diagnostic bronchoscopy with transbronchial biopsies. Patient has had not significant change from last visit in the office.  She has had some shortness of breath and some cough noted. No hemoptysis noted.   Review of Systems:     ROS performed and is unremarkable  Past Medical History  Diagnosis Date  . COPD (chronic obstructive pulmonary disease)   . Pneumonia   . Shortness of breath dyspnea   . Cancer     lefft lung mass   Past Surgical History  Procedure Laterality Date  . Tubal ligation     Social History:  reports that she has been smoking.  She does not have any smokeless tobacco history on file. She reports that she drinks about 4.2 oz of alcohol per week. She reports that she does not use illicit drugs.  Allergies  Allergen Reactions  . Hydrocodone Diarrhea and Nausea And Vomiting  . Codeine Other (See Comments)    Reaction unknown  . Ibuprofen Other (See Comments)    Reaction unknown    History reviewed. No pertinent family history.  Prior to Admission medications   Medication Sig Start Date End Date Taking? Authorizing Provider  acetaminophen (TYLENOL) 500 MG tablet Take 500 mg by mouth every 6 (six) hours as needed.   Yes Historical Provider, MD  mometasone-formoterol (DULERA) 100-5 MCG/ACT AERO Inhale 2 puffs into the lungs 1 day or 1 dose.   Yes Historical Provider, MD  mometasone-formoterol (DULERA) 100-5 MCG/ACT AERO Inhale 2 puffs into the lungs 1 day or 1 dose.   Yes Historical Provider, MD   Physical Exam: Filed Vitals:   09/17/14 1200 09/17/14 1215 09/17/14 1230 09/17/14 1245  BP: 127/72 104/78 93/70 101/72  Pulse: 114 96 84 81  Temp:      Resp: '21 18 23 21   '$ Height:      Weight:      SpO2: 93% 97% 95% 97%    Wt Readings from Last 3 Encounters:  09/17/14 40.824 kg (90 lb)  09/06/14 40.824 kg (90 lb)  08/24/14 42.14 kg (92 lb 14.4 oz)    General:  Appears calm and comfortable Eyes: PERRL, normal lids, irises & conjunctiva ENT: grossly normal hearing, lips & tongue Neck: no LAD, masses or thyromegaly Cardiovascular: RRR, no m/r/g. No LE edema. Respiratory: CTA bilaterally, no w/r/r. Normal respiratory effort. Abdomen: soft, nontender Skin: no rash or induration seen on limited exam Musculoskeletal: grossly normal tone BUE/BLE          Labs on Admission:  Basic Metabolic Panel: No results for input(s): NA, K, CL, CO2, GLUCOSE, BUN, CREATININE, CALCIUM, MG, PHOS in the last 168 hours. Liver Function Tests: No results for input(s): AST, ALT, ALKPHOS, BILITOT, PROT, ALBUMIN in the last 168 hours. No results for input(s): LIPASE, AMYLASE in the last 168 hours. No results for input(s): AMMONIA in the last 168 hours. CBC: No results for input(s): WBC, NEUTROABS, HGB, HCT, MCV, PLT in the last 168 hours. Cardiac Enzymes: No results for input(s): CKTOTAL, CKMB, CKMBINDEX, TROPONINI in the last 168 hours.  BNP (last 3 results) No results for input(s): BNP in the last 8760 hours.  ProBNP (last 3 results) No results for input(s): PROBNP in the last 8760  hours.  CBG: No results for input(s): GLUCAP in the last 168 hours.  Radiological Exams on Admission: No results found.  EKG: Independently reviewed.  Assessment/Plan   1. Cavitary lung lesion -will proceed with bronchoscopy -Risks and benefits were explained to the patient -consent obtained from the patient    I have personally obtained a history, examined the patient, evaluated laboratory and imaging results, formulated the assessment and plan and placed orders.      Allyne Gee, MD [facility] Pulmonary Critical Care Medicine Sleep Medicine

## 2014-09-20 LAB — CULTURE, BAL-QUANTITATIVE

## 2014-09-20 LAB — CULTURE, BAL-QUANTITATIVE W GRAM STAIN

## 2014-10-07 LAB — AFB CULTURE WITH SMEAR (NOT AT ARMC)
ACID FAST SMEAR: NEGATIVE
Acid Fast Culture: NEGATIVE

## 2014-10-08 LAB — AFB CULTURE WITH SMEAR (NOT AT ARMC)
Acid Fast Culture: NEGATIVE
Acid Fast Smear: NEGATIVE

## 2014-10-09 LAB — FUNGUS CULTURE W SMEAR

## 2014-10-31 LAB — ACID FAST SMEAR+CULTURE W/RFLX (ARMC ONLY)
Acid Fast Culture: NEGATIVE
Acid Fast Smear: NEGATIVE

## 2015-05-01 ENCOUNTER — Ambulatory Visit
Admission: RE | Admit: 2015-05-01 | Discharge: 2015-05-01 | Disposition: A | Discharge status: Home / Self Care | Payer: Self-pay | Source: Ambulatory Visit | Attending: Internal Medicine | Admitting: Internal Medicine

## 2015-05-01 ENCOUNTER — Other Ambulatory Visit: Payer: Self-pay | Admitting: Internal Medicine

## 2015-05-01 DIAGNOSIS — R0602 Shortness of breath: Secondary | ICD-10-CM | POA: Insufficient documentation

## 2015-05-01 DIAGNOSIS — J151 Pneumonia due to Pseudomonas: Secondary | ICD-10-CM | POA: Insufficient documentation

## 2016-04-23 ENCOUNTER — Other Ambulatory Visit: Payer: Self-pay | Admitting: Thoracic Surgery

## 2016-04-23 DIAGNOSIS — J449 Chronic obstructive pulmonary disease, unspecified: Secondary | ICD-10-CM

## 2016-04-23 DIAGNOSIS — J439 Emphysema, unspecified: Secondary | ICD-10-CM

## 2016-05-18 ENCOUNTER — Ambulatory Visit: Payer: Disability Insurance | Attending: Thoracic Surgery

## 2016-05-18 DIAGNOSIS — J449 Chronic obstructive pulmonary disease, unspecified: Secondary | ICD-10-CM | POA: Insufficient documentation

## 2016-05-18 DIAGNOSIS — J439 Emphysema, unspecified: Secondary | ICD-10-CM

## 2016-05-19 ENCOUNTER — Emergency Department
Admission: EM | Admit: 2016-05-19 | Discharge: 2016-05-19 | Disposition: A | Discharge status: Home / Self Care | Payer: Self-pay | Attending: Emergency Medicine | Admitting: Emergency Medicine

## 2016-05-19 ENCOUNTER — Other Ambulatory Visit: Payer: Self-pay

## 2016-05-19 ENCOUNTER — Emergency Department: Payer: Self-pay

## 2016-05-19 ENCOUNTER — Encounter: Payer: Self-pay | Admitting: Emergency Medicine

## 2016-05-19 DIAGNOSIS — Z79899 Other long term (current) drug therapy: Secondary | ICD-10-CM | POA: Insufficient documentation

## 2016-05-19 DIAGNOSIS — J441 Chronic obstructive pulmonary disease with (acute) exacerbation: Secondary | ICD-10-CM | POA: Insufficient documentation

## 2016-05-19 DIAGNOSIS — F17200 Nicotine dependence, unspecified, uncomplicated: Secondary | ICD-10-CM | POA: Insufficient documentation

## 2016-05-19 DIAGNOSIS — J40 Bronchitis, not specified as acute or chronic: Secondary | ICD-10-CM | POA: Insufficient documentation

## 2016-05-19 LAB — CBC
HCT: 35 % (ref 35.0–47.0)
Hemoglobin: 12.3 g/dL (ref 12.0–16.0)
MCH: 31.1 pg (ref 26.0–34.0)
MCHC: 35.1 g/dL (ref 32.0–36.0)
MCV: 88.6 fL (ref 80.0–100.0)
PLATELETS: 312 10*3/uL (ref 150–440)
RBC: 3.95 MIL/uL (ref 3.80–5.20)
RDW: 13.8 % (ref 11.5–14.5)
WBC: 8.2 10*3/uL (ref 3.6–11.0)

## 2016-05-19 LAB — BASIC METABOLIC PANEL
Anion gap: 9 (ref 5–15)
BUN: 10 mg/dL (ref 6–20)
CALCIUM: 8.8 mg/dL — AB (ref 8.9–10.3)
CHLORIDE: 102 mmol/L (ref 101–111)
CO2: 24 mmol/L (ref 22–32)
Creatinine, Ser: 0.85 mg/dL (ref 0.44–1.00)
Glucose, Bld: 140 mg/dL — ABNORMAL HIGH (ref 65–99)
Potassium: 3.5 mmol/L (ref 3.5–5.1)
SODIUM: 135 mmol/L (ref 135–145)

## 2016-05-19 LAB — BLOOD GAS, VENOUS
Acid-base deficit: 3.1 mmol/L — ABNORMAL HIGH (ref 0.0–2.0)
BICARBONATE: 21.1 mmol/L (ref 20.0–28.0)
O2 Saturation: 93 %
PATIENT TEMPERATURE: 37
PH VEN: 7.4 (ref 7.250–7.430)
pCO2, Ven: 34 mmHg — ABNORMAL LOW (ref 44.0–60.0)
pO2, Ven: 67 mmHg — ABNORMAL HIGH (ref 32.0–45.0)

## 2016-05-19 LAB — TROPONIN I: Troponin I: <0.03 ng/mL (ref <0.03)

## 2016-05-19 MED ORDER — METHYLPREDNISOLONE SODIUM SUCC 125 MG IJ SOLR
125.0000 mg | Freq: Once | INTRAMUSCULAR | Status: AC
  Administered 2016-05-19: 125 mg via INTRAVENOUS
  Filled 2016-05-19: qty 2

## 2016-05-19 MED ORDER — IPRATROPIUM-ALBUTEROL 0.5-2.5 (3) MG/3ML IN SOLN
3.0000 mL | Freq: Once | RESPIRATORY_TRACT | Status: AC
  Administered 2016-05-19: 3 mL via RESPIRATORY_TRACT
  Filled 2016-05-19: qty 3

## 2016-05-19 MED ORDER — PREDNISONE 20 MG PO TABS: 60.0000 mg | ORAL_TABLET | Freq: Every day | ORAL | 0 refills | Status: DC

## 2016-05-19 MED ORDER — LEVOFLOXACIN 750 MG PO TABS: 750.0000 mg | ORAL_TABLET | Freq: Every day | ORAL | 0 refills | Status: AC

## 2016-05-19 NOTE — ED Notes (Signed)
Pt with c/o shortness of breath. States she hasn't had more than 4 hrs sleep in the last 48 hrs.

## 2016-05-19 NOTE — ED Triage Notes (Addendum)
Pt ambulatory to triage with steady gait with c/o shortness of breath, pt states "I got pneumonia" Pt reports having cough since Christmas but has been getting worse for the past 3-4 days. (+) congested cough noted. (+) smoker

## 2016-05-19 NOTE — ED Provider Notes (Signed)
[facility] Emergency Department Provider Note   ____________________________________________   First MD Initiated Contact with Patient 05/19/16 [phone]     (approximate)  I have reviewed the triage vital signs and the nursing notes.   HISTORY  Chief Complaint Shortness of Breath    HPI Kelly Curry is a 61 y.o. female who comes into the hospital today because she thinks she has pneumonia. The patient has a history of pseudomonas. She's had symptoms for a week. She's had shortness of breath with cough. She reports that she's been unable to sleep. She thinks she may have had a fever but never took her temperature. She's had muscle aches and headache with watery eyes. The patient denies any nausea or vomiting. She reports that she's had some chest pain in her mid chest as well. The patient denies any known sick contacts. She denies any abdominal pain blurred vision or lightheadedness. The patient is here today for evaluation.The patient reports that she has been using her albuterol inhaler at home and she used right before coming into the hospital. She reports though that she shows having a lot of difficulty breathing. The albuterol helps but does not take away the symptoms. The patient rates her pain 8 out of 10 in intensity.   Past Medical History:  Diagnosis Date  . Cancer ([place])    lefft lung mass  . COPD (chronic obstructive pulmonary disease) ([place])   . Pneumonia   . Shortness of breath dyspnea     Patient Active Problem List   Diagnosis Date Noted  . Pneumonia due to Pseudomonas ([place]) 08/25/2014  . Sepsis ([place]) 08/24/2014    Past Surgical History:  Procedure Laterality Date  . TUBAL LIGATION      Prior to Admission medications   Medication Sig Start Date End Date Taking? Authorizing Provider  acetaminophen (TYLENOL) 500 MG tablet Take 500 mg by mouth every 6 (six) hours as needed.    Historical Provider, MD  levofloxacin (LEVAQUIN) 750 MG  tablet Take 1 tablet (750 mg total) by mouth daily. 05/19/16 05/26/16  Loney Hering, MD  mometasone-formoterol (DULERA) 100-5 MCG/ACT AERO Inhale 2 puffs into the lungs 1 day or 1 dose.    Historical Provider, MD  mometasone-formoterol (DULERA) 100-5 MCG/ACT AERO Inhale 2 puffs into the lungs 1 day or 1 dose.    Historical Provider, MD  predniSONE (DELTASONE) 20 MG tablet Take 3 tablets (60 mg total) by mouth daily. 05/19/16   Loney Hering, MD    Allergies Hydrocodone; Codeine; and Ibuprofen  No family history on file.  Social History Social History  Substance Use Topics  . Smoking status: Current Every Day Smoker    Packs/day: 1.00    Years: 40.00  . Smokeless tobacco: Never Used  . Alcohol use 4.2 oz/week    7 Cans of beer per week    Review of Systems Constitutional:  fever/chills Eyes: No visual changes. ENT: No sore throat. Cardiovascular: chest pain. Respiratory:  shortness of breath. Gastrointestinal: No abdominal pain.  No nausea, no vomiting.  No diarrhea.  No constipation. Genitourinary: Negative for dysuria. Musculoskeletal: Muscle aches Skin: Negative for rash. Neurological: Negative for headaches, focal weakness or numbness.  10-point ROS otherwise negative.  ____________________________________________   PHYSICAL EXAM:  VITAL SIGNS: ED Triage Vitals  Enc Vitals Group     BP 05/19/16 0321 (!) 148/95     Pulse Rate 05/19/16 0321 (!) 121     Resp 05/19/16 0321 (!) 22  Temp 05/19/16 0321 97.7 F (36.5 C)     Temp Source 05/19/16 0321 Oral     SpO2 05/19/16 0321 94 %     Weight 05/19/16 0322 114 lb (51.7 kg)     Height 05/19/16 0322 5' (1.524 m)     Head Circumference --      Peak Flow --      Pain Score 05/19/16 0322 8     Pain Loc --      Pain Edu? --      Excl. in [place]? --     Constitutional: Alert and oriented. Well appearing and in Moderate respiratory distress. Eyes: Conjunctivae are normal. PERRL. EOMI. Head: Atraumatic. Nose: No  congestion/rhinnorhea. Mouth/Throat: Mucous membranes are moist.  Oropharynx non-erythematous. Cardiovascular: Tachycardia, regular rhythm. Grossly normal heart sounds.  Good peripheral circulation. Respiratory: Normal respiratory effort.  No retractions. Expiratory wheezes in upper lung fields anteriorly and posteriorly.. Gastrointestinal: Soft and nontender. No distention. Positive bowel sounds Musculoskeletal: No lower extremity tenderness nor edema.   Neurologic:  Normal speech and language. instability. Skin:  Skin is warm, dry and intact.  Psychiatric: Mood and affect are normal.   ____________________________________________   LABS (all labs ordered are listed, but only abnormal results are displayed)  Labs Reviewed  BASIC METABOLIC PANEL - Abnormal; Notable for the following:       Result Value   Glucose, Bld 140 (*)    Calcium 8.8 (*)    All other components within normal limits  BLOOD GAS, VENOUS - Abnormal; Notable for the following:    pCO2, Ven 34 (*)    pO2, Ven 67.0 (*)    Acid-base deficit 3.1 (*)    All other components within normal limits  CBC  TROPONIN I   ____________________________________________  EKG  ED ECG REPORT I, Loney Hering, the attending physician, personally viewed and interpreted this ECG.   Date: 05/19/2016  EKG Time: 325  Rate: 114  Rhythm: sinus tachycardia  Axis: normal  Intervals:none  ST&T Change: normal  ____________________________________________  RADIOLOGY  CXR ____________________________________________   PROCEDURES  Procedure(s) performed: None  Procedures  Critical Care performed: No  ____________________________________________   INITIAL IMPRESSION / ASSESSMENT AND PLAN / ED COURSE  Pertinent labs & imaging results that were available during my care of the patient were reviewed by me and considered in my medical decision making (see chart for details).  This is a 61 year old female who comes  into the hospital today with some shortness of breath. The patient that she may have pneumonia. The patient does have some wheezing given her history of COPD. I will give the patient a DuoNeb as well as some Solu-Medrol. I checked the patient's blood work and it is unremarkable. Her white blood cell count is 8.2 and her PCO2 is 34. The patient also had a chest x-ray which did not show a pneumonia. She has a stable nodule and some scarring.  I discussed the results with the patient and her significant other. I also informed her that I would like to repeat her troponin given her initial chest pain and her shortness of breath. I also report that I would like to repeat her DuoNeb treatments and she does have some improvement in her symptoms but some mild wheezing. The patient reports that she feels improved and she does not want to stay for any further testing. She reports that she feels comfortable going home and she would like to go home to take her regular  albuterol medication. I informed the patient that as a protocol I would prefer for her to stay but if she is feeling improved that is her decision to make. The patient verbalizes understanding of this decision. I also inform the patient that I did not want her to go home and ended coming right back to the hospital with worsening symptoms but the patient reports that she thinks she'll be okay. I will give the patient some steroids for home and she will also receive some antibiotics for her bronchitis flare. The patient will be discharged home.  Clinical Course as of May 19 821  Wed May 19, 2016  0429 1. Stable nodule the left mid lung noted be in the superior segment of the left lower lobe by prior CT. 2. Chronic stable pleuroparenchymal scarring and thickening left-greater-than-right. 3. No acute pulmonary disease.   DG Chest 1 View [AW]    Clinical Course User Index [AW] Loney Hering, MD      ____________________________________________   FINAL CLINICAL IMPRESSION(S) / ED DIAGNOSES  Final diagnoses:  COPD exacerbation ([place])  Bronchitis      NEW MEDICATIONS STARTED DURING THIS VISIT:  Discharge Medication List as of 05/19/2016  6:20 AM    START taking these medications   Details  levofloxacin (LEVAQUIN) 750 MG tablet Take 1 tablet (750 mg total) by mouth daily., Starting Wed 05/19/2016, Until Wed 05/26/2016, Print    predniSONE (DELTASONE) 20 MG tablet Take 3 tablets (60 mg total) by mouth daily., Starting Wed 05/19/2016, Print         Note:  This document was prepared using Dragon voice recognition software and may include unintentional dictation errors.    Loney Hering, MD 05/19/16 [phone]

## 2017-02-14 DIAGNOSIS — R0602 Shortness of breath: Secondary | ICD-10-CM | POA: Diagnosis not present

## 2017-02-14 DIAGNOSIS — J449 Chronic obstructive pulmonary disease, unspecified: Secondary | ICD-10-CM | POA: Diagnosis not present

## 2017-02-14 DIAGNOSIS — J479 Bronchiectasis, uncomplicated: Secondary | ICD-10-CM | POA: Diagnosis not present

## 2017-02-14 DIAGNOSIS — F17218 Nicotine dependence, cigarettes, with other nicotine-induced disorders: Secondary | ICD-10-CM | POA: Diagnosis not present

## 2017-03-30 DIAGNOSIS — R0602 Shortness of breath: Secondary | ICD-10-CM | POA: Diagnosis not present

## 2017-05-09 ENCOUNTER — Ambulatory Visit: Payer: Self-pay | Admitting: Internal Medicine

## 2017-06-02 ENCOUNTER — Encounter: Payer: Self-pay | Admitting: Internal Medicine

## 2017-06-02 ENCOUNTER — Ambulatory Visit: Payer: Medicare HMO | Admitting: Internal Medicine

## 2017-06-02 VITALS — BP 112/79 | HR 63 | Resp 16 | Ht 59.75 in | Wt 116.0 lb

## 2017-06-02 DIAGNOSIS — F17219 Nicotine dependence, cigarettes, with unspecified nicotine-induced disorders: Secondary | ICD-10-CM | POA: Diagnosis not present

## 2017-06-02 DIAGNOSIS — J189 Pneumonia, unspecified organism: Secondary | ICD-10-CM | POA: Diagnosis not present

## 2017-06-02 DIAGNOSIS — J449 Chronic obstructive pulmonary disease, unspecified: Secondary | ICD-10-CM | POA: Diagnosis not present

## 2017-06-02 MED ORDER — FLUTICASONE FUROATE-VILANTEROL 100-25 MCG/INH IN AEPB: 1.0000 | INHALATION_SPRAY | Freq: Every day | RESPIRATORY_TRACT | 5 refills | Status: DC

## 2017-06-02 NOTE — Progress Notes (Signed)
[facility] [place], [place] [address]  Pulmonary Sleep Medicine  Office Visit Note  Patient Name: Kelly Curry DOB: 1955-06-08 MRN [id number]  Date of Service: 06/02/2017  Complaints/HPI: She had PFT done shows MODERATE COPD. Patient is still smoking about a pack a day. She staets that she has not been able to afford her medications. Now has insurance and will want a script for her breo. She has no cough or sputum. She has no admissions to the hospital. No wheeze noted  ROS  General: (-) fever, (-) chills, (-) night sweats, (-) weakness Skin: (-) rashes, (-) itching,. Eyes: (-) visual changes, (-) redness, (-) itching. Nose and Sinuses: (-) nasal stuffiness or itchiness, (-) postnasal drip, (-) nosebleeds, (-) sinus trouble. Mouth and Throat: (-) sore throat, (-) hoarseness. Neck: (-) swollen glands, (-) enlarged thyroid, (-) neck pain. Respiratory: - cough, (-) bloody sputum, + shortness of breath, - wheezing. Cardiovascular: - ankle swelling, (-) chest pain. Lymphatic: (-) lymph node enlargement. Neurologic: (-) numbness, (-) tingling. Psychiatric: (-) anxiety, (-) depression   Current Medication: Outpatient Encounter Medications as of 06/02/2017  Medication Sig  . acetaminophen (TYLENOL) 500 MG tablet Take 500 mg by mouth every 6 (six) hours as needed.  . fluticasone furoate-vilanterol (BREO ELLIPTA) 100-25 MCG/INH AEPB Inhale 1 puff into the lungs daily.  . mometasone-formoterol (DULERA) 100-5 MCG/ACT AERO Inhale 2 puffs into the lungs 1 day or 1 dose.  . mometasone-formoterol (DULERA) 100-5 MCG/ACT AERO Inhale 2 puffs into the lungs 1 day or 1 dose.  . predniSONE (DELTASONE) 20 MG tablet Take 3 tablets (60 mg total) by mouth daily. (Patient not taking: Reported on 06/02/2017)   No facility-administered encounter medications on file as of 06/02/2017.     Surgical History: Past Surgical History:  Procedure Laterality Date  . LUNG BIOPSY  2016  .  TUBAL LIGATION      Medical History: Past Medical History:  Diagnosis Date  . 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 COPD (chronic obstructive pulmonary disease) ([place])   . Pneumonia   . Shortness of breath dyspnea     Family History: Family History  Problem Relation Age of Onset  . Breast cancer Mother   . COPD Father   . Emphysema Father     Social History: Social History   Socioeconomic History  . Marital status: Married    Spouse name: Not on file  . Number of children: Not on file  . Years of education: Not on file  . Highest education level: Not on file  Social Needs  . Financial resource strain: Not on file  . Food insecurity - worry: Not on file  . Food insecurity - inability: Not on file  . Transportation needs - medical: Not on file  . Transportation needs - non-medical: Not on file  Occupational History  . Not on file  Tobacco Use  . Smoking status: Current Every Day Smoker    Packs/day: 1.00    Years: 40.00    Pack years: 40.00  . Smokeless tobacco: Never Used  Substance and Sexual Activity  . Alcohol use: Yes    Alcohol/week: 4.2 oz    Types: 7 Cans of beer per week  . Drug use: No  . Sexual activity: Not on file  Other Topics Concern  . Not on file  Social History Narrative  . Not on file    Vital Signs: Blood pressure 112/79, pulse 63, resp. rate 16,  height 4' 11.75" (1.518 m), weight 116 lb (52.6 kg), SpO2 97 %.  Examination: General Appearance: The patient is well-developed, well-nourished, and in no distress. Skin: Gross inspection of skin unremarkable. Head: normocephalic, no gross deformities. Eyes: no gross deformities noted. ENT: ears appear grossly normal no exudates. Neck: Supple. No thyromegaly. No LAD. Respiratory: scattered noted. Cardiovascular: Normal S1 and S2 without murmur or rub. Extremities: No cyanosis. pulses are equal. Neurologic: Alert and oriented. No involuntary movements.  LABS: No results found for this or  any previous visit (from the past 2160 hour(s)).  Radiology: Dg Chest 1 View  Result Date: 05/19/2016 CLINICAL DATA:  Dyspnea EXAM: CHEST 1 VIEW COMPARISON:  Chief CT 09/06/2014, CXR 05/01/2015 FINDINGS: The heart size and mediastinal contours are within normal limits. Apical pleuroparenchymal thickening and scarring noted at at the apices left-greater-than-right. Stable 7 mm nodular opacity adjacent to left hilum. The visualized skeletal structures are unremarkable. IMPRESSION: 1. Stable nodule the left mid lung noted be in the superior segment of the left lower lobe by prior CT. 2. Chronic stable pleuroparenchymal scarring and thickening left-greater-than-right. 3. No acute pulmonary disease. Electronically Signed   By: Ashley Royalty M.D.   On: 05/19/2016 03:51    No results found.  No results found.    Assessment and Plan: Patient Active Problem List   Diagnosis Date Noted  . Pneumonia due to Pseudomonas ([place]) 08/25/2014  . Sepsis ([place]) 08/24/2014    1. COPD as noted PFT done Moderate disease. She needs to stop smoking. Also gave her script for breo she has not had the same response with trelegy that she has had with breo 2. Smoker discussed smoking cessation at length with her again 3. Pneumonia resolved  General Counseling: I have discussed the findings of the evaluation and examination with Kelly Curry.  I have also discussed any further diagnostic evaluation thatmay be needed or ordered today. Kelly Curry verbalizes understanding of the findings of todays visit. We also reviewed her medications today and discussed drug interactions and side effects including but not limited excessive drowsiness and altered mental states. We also discussed that there is always a risk not just to her but also people around her. she has been encouraged to call the office with any questions or concerns that should arise related to todays visit.    Time spent: 25min  I have personally obtained a history,  examined the patient, evaluated laboratory and imaging results, formulated the assessment and plan and placed orders.    Allyne Gee, MD [facility] Pulmonary and Critical Care Sleep medicine

## 2017-07-14 ENCOUNTER — Other Ambulatory Visit: Payer: Self-pay

## 2017-07-14 DIAGNOSIS — J449 Chronic obstructive pulmonary disease, unspecified: Secondary | ICD-10-CM | POA: Insufficient documentation

## 2017-07-14 MED ORDER — ALBUTEROL SULFATE (2.5 MG/3ML) 0.083% IN NEBU: 2.5000 mg | INHALATION_SOLUTION | RESPIRATORY_TRACT | 5 refills | Status: AC | PRN

## 2017-12-01 ENCOUNTER — Ambulatory Visit: Payer: Self-pay

## 2017-12-26 IMAGING — DX DG CHEST 1V
1 series · 1 of 1 positions shown · non-contrast
Comparison: Chief CT 09/06/2014, CXR 05/01/2015

CLINICAL DATA: Dyspnea

EXAM:
CHEST 1 VIEW

[chest ap]
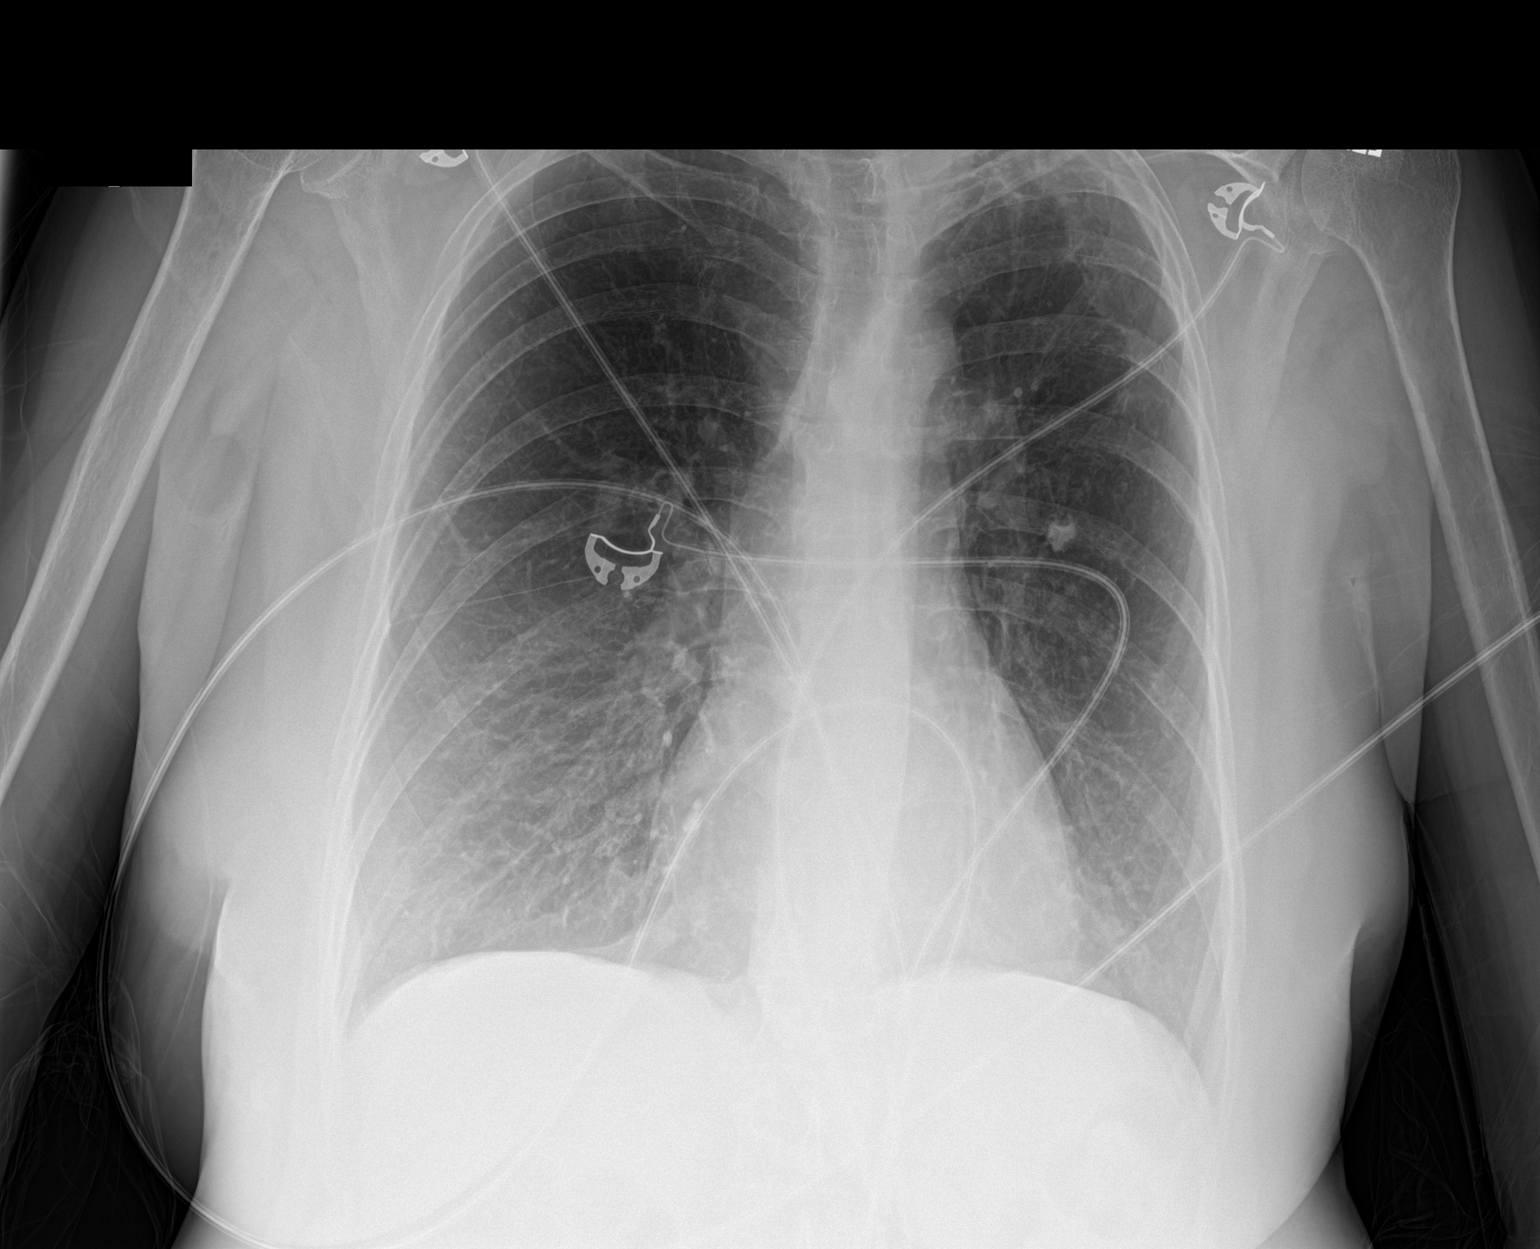

[1 of 1 positions shown; findings below may reference images not displayed]

FINDINGS: The heart size and mediastinal contours are within normal limits.
Apical pleuroparenchymal thickening and scarring noted at at the
apices left-greater-than-right. Stable 7 mm nodular opacity adjacent
to left hilum. The visualized skeletal structures are unremarkable.
IMPRESSION: 1. Stable nodule the left mid lung noted be in the superior segment
of the left lower lobe by prior CT.
2. Chronic stable pleuroparenchymal scarring and thickening
left-greater-than-right.
3. No acute pulmonary disease.

## 2018-09-28 ENCOUNTER — Encounter: Payer: Self-pay | Admitting: Internal Medicine

## 2018-09-28 ENCOUNTER — Other Ambulatory Visit: Payer: Self-pay

## 2018-09-28 ENCOUNTER — Ambulatory Visit: Payer: Medicare HMO | Admitting: Internal Medicine

## 2018-09-28 VITALS — BP 118/78 | HR 73 | Resp 16 | Ht 59.0 in | Wt 108.0 lb

## 2018-09-28 DIAGNOSIS — R0602 Shortness of breath: Secondary | ICD-10-CM | POA: Diagnosis not present

## 2018-09-28 DIAGNOSIS — J151 Pneumonia due to Pseudomonas: Secondary | ICD-10-CM | POA: Diagnosis not present

## 2018-09-28 DIAGNOSIS — J449 Chronic obstructive pulmonary disease, unspecified: Secondary | ICD-10-CM | POA: Diagnosis not present

## 2018-09-28 DIAGNOSIS — F17210 Nicotine dependence, cigarettes, uncomplicated: Secondary | ICD-10-CM | POA: Diagnosis not present

## 2018-09-28 MED ORDER — FLUTICASONE FUROATE-VILANTEROL 100-25 MCG/INH IN AEPB
1.0000 | INHALATION_SPRAY | Freq: Every day | RESPIRATORY_TRACT | 5 refills | Status: DC
Start: 2018-09-28 — End: 2019-01-29

## 2018-09-28 NOTE — Patient Instructions (Signed)
Steps to Quit Smoking    Smoking tobacco can be bad for your health. It can also affect almost every organ in your body. Smoking puts you and people around you at risk for many serious long-lasting (chronic) diseases. Quitting smoking is hard, but it is one of the best things that you can do for your health. It is never too late to quit.  What are the benefits of quitting smoking?  When you quit smoking, you lower your risk for getting serious diseases and conditions. They can include:  · Lung cancer or lung disease.  · Heart disease.  · Stroke.  · Heart attack.  · Not being able to have children (infertility).  · Weak bones (osteoporosis) and broken bones (fractures).  If you have coughing, wheezing, and shortness of breath, those symptoms may get better when you quit. You may also get sick less often. If you are pregnant, quitting smoking can help to lower your chances of having a baby of low birth weight.  What can I do to help me quit smoking?  Talk with your doctor about what can help you quit smoking. Some things you can do (strategies) include:  · Quitting smoking totally, instead of slowly cutting back how much you smoke over a period of time.  · Going to in-person counseling. You are more likely to quit if you go to many counseling sessions.  · Using resources and support systems, such as:  ? Online chats with a counselor.  ? Phone quitlines.  ? Printed self-help materials.  ? Support groups or group counseling.  ? Text messaging programs.  ? Mobile phone apps or applications.  · Taking medicines. Some of these medicines may have nicotine in them. If you are pregnant or breastfeeding, do not take any medicines to quit smoking unless your doctor says it is okay. Talk with your doctor about counseling or other things that can help you.  Talk with your doctor about using more than one strategy at the same time, such as taking medicines while you are also going to in-person counseling. This can help make  quitting easier.  What things can I do to make it easier to quit?  Quitting smoking might feel very hard at first, but there is a lot that you can do to make it easier. Take these steps:  · Talk to your family and friends. Ask them to support and encourage you.  · Call phone quitlines, reach out to support groups, or work with a counselor.  · Ask people who smoke to not smoke around you.  · Avoid places that make you want (trigger) to smoke, such as:  ? Bars.  ? Parties.  ? Smoke-break areas at work.  · Spend time with people who do not smoke.  · Lower the stress in your life. Stress can make you want to smoke. Try these things to help your stress:  ? Getting regular exercise.  ? Deep-breathing exercises.  ? Yoga.  ? Meditating.  ? Doing a body scan. To do this, close your eyes, focus on one area of your body at a time from head to toe, and notice which parts of your body are tense. Try to relax the muscles in those areas.  · Download or buy apps on your mobile phone or tablet that can help you stick to your quit plan. There are many free apps, such as QuitGuide from the CDC (Centers for Disease Control and Prevention). You can find more   support from smokefree.gov and other websites.  This information is not intended to replace advice given to you by your health care provider. Make sure you discuss any questions you have with your health care provider.  Document Released: 02/06/2009 Document Revised: 12/09/2015 Document Reviewed: 08/27/2014  Elsevier Interactive Patient Education © 2019 [employer]vier Inc.

## 2018-09-28 NOTE — Progress Notes (Signed)
[facility] [place], [place] [address]  Pulmonary Sleep Medicine   Office Visit Note  Patient Name: Kelly Curry DOB: December 06, 1955 MRN [id number]  Date of Service: 09/28/2018  Complaints/HPI: Overall she is doing better. She has some allergy issues. She has been in [place] for a while. She states that she got stuck there with some family issues. She states she has no flare ups of her bronchitis. She is still smoking. She has a history of Pseudomonas pneumonia. Some cough is noted no hemoptysis. Patient states that she has no chest pain or tightness. Patient has no fevers noted  ROS  General: (-) fever, (-) chills, (-) night sweats, (-) weakness Skin: (-) rashes, (-) itching,. Eyes: (-) visual changes, (-) redness, (-) itching. Nose and Sinuses: (-) nasal stuffiness or itchiness, (-) postnasal drip, (-) nosebleeds, (-) sinus trouble. Mouth and Throat: (-) sore throat, (-) hoarseness. Neck: (-) swollen glands, (-) enlarged thyroid, (-) neck pain. Respiratory: + cough, (-) bloody sputum, + shortness of breath, - wheezing. Cardiovascular: - ankle swelling, (-) chest pain. Lymphatic: (-) lymph node enlargement. Neurologic: (-) numbness, (-) tingling. Psychiatric: (-) anxiety, (-) depression   Current Medication: Outpatient Encounter Medications as of 09/28/2018  Medication Sig  . acetaminophen (TYLENOL) 500 MG tablet Take 500 mg by mouth every 6 (six) hours as needed.  . fluticasone furoate-vilanterol (BREO ELLIPTA) 100-25 MCG/INH AEPB Inhale 1 puff into the lungs daily.  Marland Kitchen omeprazole (PRILOSEC) 20 MG capsule Take 20 mg by mouth daily.  . [DISCONTINUED] mometasone-formoterol (DULERA) 100-5 MCG/ACT AERO Inhale 2 puffs into the lungs 1 day or 1 dose.  . [DISCONTINUED] mometasone-formoterol (DULERA) 100-5 MCG/ACT AERO Inhale 2 puffs into the lungs 1 day or 1 dose.  . [DISCONTINUED] predniSONE (DELTASONE) 20 MG tablet Take 3 tablets (60 mg total) by mouth daily.  (Patient not taking: Reported on 06/02/2017)   No facility-administered encounter medications on file as of 09/28/2018.     Surgical History: Past Surgical History:  Procedure Laterality Date  . LUNG BIOPSY  2016  . TUBAL LIGATION      Medical History: Past Medical History:  Diagnosis Date  . 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 COPD (chronic obstructive pulmonary disease) ([place])   . Pneumonia   . Shortness of breath dyspnea     Family History: Family History  Problem Relation Age of Onset  . Breast cancer Mother   . COPD Father   . Emphysema Father     Social History: Social History   Socioeconomic History  . Marital status: Married    Spouse name: Not on file  . Number of children: Not on file  . Years of education: Not on file  . Highest education level: Not on file  Occupational History  . Not on file  Social Needs  . Financial resource strain: Not on file  . Food insecurity:    Worry: Not on file    Inability: Not on file  . Transportation needs:    Medical: Not on file    Non-medical: Not on file  Tobacco Use  . Smoking status: Current Every Day Smoker    Packs/day: 1.00    Years: 40.00    Pack years: 40.00  . Smokeless tobacco: Never Used  Substance and Sexual Activity  . Alcohol use: Yes    Alcohol/week: 7.0 standard drinks    Types: 7 Cans of beer per week  . Drug use: No  . Sexual activity:  Not on file  Lifestyle  . Physical activity:    Days per week: Not on file    Minutes per session: Not on file  . Stress: Not on file  Relationships  . Social connections:    Talks on phone: Not on file    Gets together: Not on file    Attends religious service: Not on file    Active member of club or organization: Not on file    Attends meetings of clubs or organizations: Not on file    Relationship status: Not on file  . Intimate partner violence:    Fear of current or ex partner: Not on file    Emotionally abused: Not on file    Physically abused:  Not on file    Forced sexual activity: Not on file  Other Topics Concern  . Not on file  Social History Narrative  . Not on file    Vital Signs: Blood pressure 118/78, pulse 73, resp. rate 16, height 4\' 11"  (1.499 m), weight 108 lb (49 kg), SpO2 98 %.  Examination: General Appearance: The patient is well-developed, well-nourished, and in no distress. Skin: Gross inspection of skin unremarkable. Head: normocephalic, no gross deformities. Eyes: no gross deformities noted. ENT: ears appear grossly normal no exudates. Neck: Supple. No thyromegaly. No LAD. Respiratory: no rhochi noted at this time. Cardiovascular: Normal S1 and S2 without murmur or rub. Extremities: No cyanosis. pulses are equal. Neurologic: Alert and oriented. No involuntary movements.  LABS: No results found for this or any previous visit (from the past 2160 hour(s)).  Radiology: Dg Chest 1 View  Result Date: 05/19/2016 CLINICAL DATA:  Dyspnea EXAM: CHEST 1 VIEW COMPARISON:  Chief CT 09/06/2014, CXR 05/01/2015 FINDINGS: The heart size and mediastinal contours are within normal limits. Apical pleuroparenchymal thickening and scarring noted at at the apices left-greater-than-right. Stable 7 mm nodular opacity adjacent to left hilum. The visualized skeletal structures are unremarkable. IMPRESSION: 1. Stable nodule the left mid lung noted be in the superior segment of the left lower lobe by prior CT. 2. Chronic stable pleuroparenchymal scarring and thickening left-greater-than-right. 3. No acute pulmonary disease. Electronically Signed   By: Ashley Royalty M.D.   On: 05/19/2016 03:51    No results found.  No results found.    Assessment and Plan: Patient Active Problem List   Diagnosis Date Noted  . Pneumonia due to Pseudomonas ([place]) 08/25/2014  . Sepsis ([place]) 08/24/2014    1. COPD needs follow up PFT unfortunately she still continues to smoke. Counseling provided. Patient states breo is helping refill given.  Patient also needs to have the sputum cultures rechecked 2. Psuedomonas Pneumonia. Will recheck cultures next visit 3. Smoker she is still smoking 1.5PPD now. Smoking cessation counseling provided  General Counseling: I have discussed the findings of the evaluation and examination with [name].  I have also discussed any further diagnostic evaluation thatmay be needed or ordered today. Dillon verbalizes understanding of the findings of todays visit. We also reviewed her medications today and discussed drug interactions and side effects including but not limited excessive drowsiness and altered mental states. We also discussed that there is always a risk not just to her but also people around her. she has been encouraged to call the office with any questions or concerns that should arise related to todays visit.    Time spent: 16min  I have personally obtained a history, examined the patient, evaluated laboratory and imaging results, formulated the assessment and plan  and placed orders.    Allyne Gee, MD [facility] Pulmonary and Critical Care Sleep medicine

## 2018-11-15 ENCOUNTER — Ambulatory Visit (INDEPENDENT_AMBULATORY_CARE_PROVIDER_SITE_OTHER): Payer: Medicare HMO | Admitting: Internal Medicine

## 2018-11-15 ENCOUNTER — Other Ambulatory Visit: Payer: Self-pay

## 2018-11-15 DIAGNOSIS — R0602 Shortness of breath: Secondary | ICD-10-CM | POA: Diagnosis not present

## 2018-11-15 LAB — PULMONARY FUNCTION TEST

## 2018-11-26 NOTE — Procedures (Signed)
[facility] [address] [place], [address]  DATE OF SERVICE: 11/15/2018  Complete Pulmonary Function Testing Interpretation:  FINDINGS:  Forced vital capacity is mildly decreased.  The FEV1 is severely decreased.  The FEV1 FVC ratio is moderately decreased.  Postbronchodilator there is no significant change in the FEV1.  Total lung capacity increased residual volume increased residual volume total lung capacity ratio increased gas volume increased.  DLCO is moderately decreased.  IMPRESSION:  This pulmonary function study is consistent with severe obstructive lung disease.  DLCO is moderately decreased.  There is no significant response to bronchodilator.  Allyne Gee, MD [facility] Pulmonary Critical Care Medicine Sleep Medicine

## 2019-01-29 ENCOUNTER — Encounter: Payer: Self-pay | Admitting: Internal Medicine

## 2019-01-29 ENCOUNTER — Other Ambulatory Visit: Payer: Self-pay

## 2019-01-29 ENCOUNTER — Ambulatory Visit: Payer: Medicare HMO | Admitting: Internal Medicine

## 2019-01-29 VITALS — BP 110/70 | HR 72 | Temp 97.2°F | Resp 16 | Ht 60.0 in | Wt 116.6 lb

## 2019-01-29 DIAGNOSIS — K219 Gastro-esophageal reflux disease without esophagitis: Secondary | ICD-10-CM | POA: Diagnosis not present

## 2019-01-29 DIAGNOSIS — R0602 Shortness of breath: Secondary | ICD-10-CM | POA: Diagnosis not present

## 2019-01-29 DIAGNOSIS — F17210 Nicotine dependence, cigarettes, uncomplicated: Secondary | ICD-10-CM | POA: Diagnosis not present

## 2019-01-29 DIAGNOSIS — J449 Chronic obstructive pulmonary disease, unspecified: Secondary | ICD-10-CM | POA: Diagnosis not present

## 2019-01-29 DIAGNOSIS — J4489 Other specified chronic obstructive pulmonary disease: Secondary | ICD-10-CM

## 2019-01-29 MED ORDER — BREO ELLIPTA 100-25 MCG/INH IN AEPB: 1.0000 | INHALATION_SPRAY | Freq: Every day | RESPIRATORY_TRACT | 5 refills | Status: DC

## 2019-01-29 NOTE — Progress Notes (Signed)
[facility] [place], [place] [address]  Pulmonary Sleep Medicine   Office Visit Note  Patient Name: Kelly Curry DOB: 1955/10/16 MRN [id number]  Date of Service: 01/29/2019  Complaints/HPI: Pt is here for follow up.  She reports overall she has been doing fair.  Her allergies have been flaring up with the season change.  She continues to smoke over a pack a day.  Overall she is doing fair.  Denies any sickness, and recent hospitalizations.    ROS  General: (-) fever, (-) chills, (-) night sweats, (-) weakness Skin: (-) rashes, (-) itching,. Eyes: (-) visual changes, (-) redness, (-) itching. Nose and Sinuses: (-) nasal stuffiness or itchiness, (-) postnasal drip, (-) nosebleeds, (-) sinus trouble. Mouth and Throat: (-) sore throat, (-) hoarseness. Neck: (-) swollen glands, (-) enlarged thyroid, (-) neck pain. Respiratory: - cough, (-) bloody sputum, + shortness of breath, - wheezing. Cardiovascular: - ankle swelling, (-) chest pain. Lymphatic: (-) lymph node enlargement. Neurologic: (-) numbness, (-) tingling. Psychiatric: (-) anxiety, (-) depression   Current Medication: Outpatient Encounter Medications as of 01/29/2019  Medication Sig  . acetaminophen (TYLENOL) 500 MG tablet Take 500 mg by mouth every 6 (six) hours as needed.  . fluticasone furoate-vilanterol (BREO ELLIPTA) 100-25 MCG/INH AEPB Inhale 1 puff into the lungs daily.  Marland Kitchen omeprazole (PRILOSEC) 20 MG capsule Take 20 mg by mouth daily.   No facility-administered encounter medications on file as of 01/29/2019.     Surgical History: Past Surgical History:  Procedure Laterality Date  . LUNG BIOPSY  2016  . TUBAL LIGATION      Medical History: Past Medical History:  Diagnosis Date  . 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 COPD (chronic obstructive pulmonary disease) ([place])   . Pneumonia   . Shortness of breath dyspnea     Family History: Family History  Problem Relation Age of  Onset  . Breast cancer Mother   . COPD Father   . Emphysema Father     Social History: Social History   Socioeconomic History  . Marital status: Married    Spouse name: Not on file  . Number of children: Not on file  . Years of education: Not on file  . Highest education level: Not on file  Occupational History  . Not on file  Social Needs  . Financial resource strain: Not on file  . Food insecurity    Worry: Not on file    Inability: Not on file  . Transportation needs    Medical: Not on file    Non-medical: Not on file  Tobacco Use  . Smoking status: Current Every Day Smoker    Packs/day: 1.00    Years: 40.00    Pack years: 40.00  . Smokeless tobacco: Never Used  Substance and Sexual Activity  . Alcohol use: Yes    Alcohol/week: 7.0 standard drinks    Types: 7 Cans of beer per week  . Drug use: No  . Sexual activity: Not on file  Lifestyle  . Physical activity    Days per week: Not on file    Minutes per session: Not on file  . Stress: Not on file  Relationships  . Social Herbalist on phone: Not on file    Gets together: Not on file    Attends religious service: Not on file    Active member of club or organization: Not on file    Attends  meetings of clubs or organizations: Not on file    Relationship status: Not on file  . Intimate partner violence    Fear of current or ex partner: Not on file    Emotionally abused: Not on file    Physically abused: Not on file    Forced sexual activity: Not on file  Other Topics Concern  . Not on file  Social History Narrative  . Not on file    Vital Signs: Blood pressure 110/70, pulse 72, temperature (!) 97.2 F (36.2 C), resp. rate 16, height 5' (1.524 m), weight 116 lb 9.6 oz (52.9 kg), SpO2 97 %.  Examination: General Appearance: The patient is well-developed, well-nourished, and in no distress. Skin: Gross inspection of skin unremarkable. Head: normocephalic, no gross deformities. Eyes: no gross  deformities noted. ENT: ears appear grossly normal no exudates. Neck: Supple. No thyromegaly. No LAD. Respiratory: clear bilaterally. Cardiovascular: Normal S1 and S2 without murmur or rub. Extremities: No cyanosis. pulses are equal. Neurologic: Alert and oriented. No involuntary movements.  LABS: Recent Results (from the past 2160 hour(s))  Pulmonary function test     Status: None   Collection Time: 11/15/18  9:00 AM  Result Value Ref Range   FEV1     FVC     FEV1/FVC     TLC     DLCO      Radiology: Dg Chest 1 View  Result Date: 05/19/2016 CLINICAL DATA:  Dyspnea EXAM: CHEST 1 VIEW COMPARISON:  Chief CT 09/06/2014, CXR 05/01/2015 FINDINGS: The heart size and mediastinal contours are within normal limits. Apical pleuroparenchymal thickening and scarring noted at at the apices left-greater-than-right. Stable 7 mm nodular opacity adjacent to left hilum. The visualized skeletal structures are unremarkable. IMPRESSION: 1. Stable nodule the left mid lung noted be in the superior segment of the left lower lobe by prior CT. 2. Chronic stable pleuroparenchymal scarring and thickening left-greater-than-right. 3. No acute pulmonary disease. Electronically Signed   By: Ashley Royalty M.D.   On: 05/19/2016 03:51    No results found.  No results found.    Assessment and Plan: Patient Active Problem List   Diagnosis Date Noted  . Pneumonia due to Pseudomonas ([place]) 08/25/2014  . Sepsis ([place]) 08/24/2014    1. Obstructive chronic bronchitis without exacerbation ([place]) Continue to encourage smoking cessation. Continue to use breo as directed.  - fluticasone furoate-vilanterol (BREO ELLIPTA) 100-25 MCG/INH AEPB; Inhale 1 puff into the lungs daily.  Dispense: 60 each; Refill: 5  2. Very heavy cigarette smoker Smoking cessation counseling: 1. Pt acknowledges the risks of long term smoking, she will try to quite smoking. 2. Options for different medications including nicotine products, chewing  gum, patch etc, Wellbutrin and Chantix is discussed 3. Goal and date of compete cessation is discussed 4. Total time spent in smoking cessation is 15 min.  3. Gastroesophageal reflux disease without esophagitis Gave patient samples of Nexium, she will try before sending Rx.   4. SOB (shortness of breath) - Spirometry with Graph   General Counseling: I have discussed the findings of the evaluation and examination with [name].  I have also discussed any further diagnostic evaluation thatmay be needed or ordered today. Enma verbalizes understanding of the findings of todays visit. We also reviewed her medications today and discussed drug interactions and side effects including but not limited excessive drowsiness and altered mental states. We also discussed that there is always a risk not just to her but also people around her. she  has been encouraged to call the office with any questions or concerns that should arise related to todays visit.    Time spent: 15 This patient was seen by Orson Gear AGNP-C in Collaboration with Dr. Devona Konig as a part of collaborative care agreement.   I have personally obtained a history, examined the patient, evaluated laboratory and imaging results, formulated the assessment and plan and placed orders.    Allyne Gee, MD [facility] Pulmonary and Critical Care Sleep medicine

## 2019-07-31 ENCOUNTER — Telehealth: Payer: Self-pay

## 2019-07-31 NOTE — Telephone Encounter (Signed)
Called lmom informing patient of appointment on 08/02/2019. klh 

## 2019-08-01 ENCOUNTER — Telehealth: Payer: Self-pay

## 2019-08-01 NOTE — Telephone Encounter (Signed)
Confirmed appointment on 08/02/2019 and screened for covid. klh

## 2019-08-02 ENCOUNTER — Ambulatory Visit: Payer: Medicare HMO | Admitting: Internal Medicine

## 2019-08-02 ENCOUNTER — Encounter: Payer: Self-pay | Admitting: Internal Medicine

## 2019-08-02 ENCOUNTER — Other Ambulatory Visit: Payer: Self-pay

## 2019-08-02 VITALS — BP 140/75 | HR 84 | Temp 97.1°F | Resp 16 | Ht 60.0 in | Wt 117.6 lb

## 2019-08-02 DIAGNOSIS — R0602 Shortness of breath: Secondary | ICD-10-CM

## 2019-08-02 DIAGNOSIS — K219 Gastro-esophageal reflux disease without esophagitis: Secondary | ICD-10-CM

## 2019-08-02 DIAGNOSIS — J449 Chronic obstructive pulmonary disease, unspecified: Secondary | ICD-10-CM | POA: Diagnosis not present

## 2019-08-02 DIAGNOSIS — F17210 Nicotine dependence, cigarettes, uncomplicated: Secondary | ICD-10-CM

## 2019-08-02 MED ORDER — BREO ELLIPTA 100-25 MCG/INH IN AEPB: 1.0000 | INHALATION_SPRAY | Freq: Every day | RESPIRATORY_TRACT | 5 refills | Status: DC

## 2019-08-02 NOTE — Progress Notes (Signed)
[facility] [place], [place] [address]  Pulmonary Sleep Medicine   Office Visit Note  Patient Name: Kelly Curry DOB: 16-May-1955 MRN [id number]  Date of Service: 08/02/2019  Complaints/HPI: Pt is here for pulmonary follow up.  She has a history of COPD, GERD Nicotine dependence.  She reports she is using her Breo daily.  She is having some shortness of breath with exertion.  She reports having to sit down while doing activities, in order to get her breath back.  She continues to smoke at this time.  She reports 1.5 PPD.    ROS  General: (-) fever, (-) chills, (-) night sweats, (-) weakness Skin: (-) rashes, (-) itching,. Eyes: (-) visual changes, (-) redness, (-) itching. Nose and Sinuses: (-) nasal stuffiness or itchiness, (-) postnasal drip, (-) nosebleeds, (-) sinus trouble. Mouth and Throat: (-) sore throat, (-) hoarseness. Neck: (-) swollen glands, (-) enlarged thyroid, (-) neck pain. Respiratory: + cough, (-) bloody sputum, +shortness of breath, - wheezing. Cardiovascular: - ankle swelling, (-) chest pain. Lymphatic: (-) lymph node enlargement. Neurologic: (-) numbness, (-) tingling. Psychiatric: (-) anxiety, (-) depression   Current Medication: Outpatient Encounter Medications as of 08/02/2019  Medication Sig  . acetaminophen (TYLENOL) 500 MG tablet Take 500 mg by mouth every 6 (six) hours as needed.  . fluticasone furoate-vilanterol (BREO ELLIPTA) 100-25 MCG/INH AEPB Inhale 1 puff into the lungs daily.  Marland Kitchen omeprazole (PRILOSEC) 20 MG capsule Take 20 mg by mouth daily.   No facility-administered encounter medications on file as of 08/02/2019.    Surgical History: Past Surgical History:  Procedure Laterality Date  . LUNG BIOPSY  2016  . TUBAL LIGATION      Medical History: Past Medical History:  Diagnosis Date  . 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 COPD (chronic obstructive pulmonary disease) ([place])   . Pneumonia   . Shortness of breath  dyspnea     Family History: Family History  Problem Relation Age of Onset  . Breast cancer Mother   . COPD Father   . Emphysema Father     Social History: Social History   Socioeconomic History  . Marital status: Married    Spouse name: Not on file  . Number of children: Not on file  . Years of education: Not on file  . Highest education level: Not on file  Occupational History  . Not on file  Tobacco Use  . Smoking status: Current Every Day Smoker    Packs/day: 1.00    Years: 40.00    Pack years: 40.00  . Smokeless tobacco: Never Used  Substance and Sexual Activity  . Alcohol use: Yes    Alcohol/week: 7.0 standard drinks    Types: 7 Cans of beer per week  . Drug use: No  . Sexual activity: Not on file  Other Topics Concern  . Not on file  Social History Narrative  . Not on file   Social Determinants of Health   Financial Resource Strain:   . Difficulty of Paying Living Expenses:   Food Insecurity:   . Worried About Charity fundraiser in the Last Year:   . Arboriculturist in the Last Year:   Transportation Needs:   . Film/video editor (Medical):   Marland Kitchen Lack of Transportation (Non-Medical):   Physical Activity:   . Days of Exercise per Week:   . Minutes of Exercise per Session:   Stress:   . Feeling  of Stress :   Social Connections:   . Frequency of Communication with Friends and Family:   . Frequency of Social Gatherings with Friends and Family:   . Attends Religious Services:   . Active Member of Clubs or Organizations:   . Attends Archivist Meetings:   Marland Kitchen Marital Status:   Intimate Partner Violence:   . Fear of Current or Ex-Partner:   . Emotionally Abused:   Marland Kitchen Physically Abused:   . Sexually Abused:     Vital Signs: Blood pressure 140/75, pulse 84, temperature (!) 97.1 F (36.2 C), resp. rate 16, height 5' (1.524 m), weight 117 lb 9.6 oz (53.3 kg), SpO2 98 %.  Examination: General Appearance: The patient is well-developed,  well-nourished, and in no distress. Skin: Gross inspection of skin unremarkable. Head: normocephalic, no gross deformities. Eyes: no gross deformities noted. ENT: ears appear grossly normal no exudates. Neck: Supple. No thyromegaly. No LAD. Respiratory: Wheezing. Cardiovascular: Normal S1 and S2 without murmur or rub. Extremities: No cyanosis. pulses are equal. Neurologic: Alert and oriented. No involuntary movements.  LABS: No results found for this or any previous visit (from the past 2160 hour(s)).  Radiology: DG Chest 1 View  Result Date: 05/19/2016 CLINICAL DATA:  Dyspnea EXAM: CHEST 1 VIEW COMPARISON:  Chief CT 09/06/2014, CXR 05/01/2015 FINDINGS: The heart size and mediastinal contours are within normal limits. Apical pleuroparenchymal thickening and scarring noted at at the apices left-greater-than-right. Stable 7 mm nodular opacity adjacent to left hilum. The visualized skeletal structures are unremarkable. IMPRESSION: 1. Stable nodule the left mid lung noted be in the superior segment of the left lower lobe by prior CT. 2. Chronic stable pleuroparenchymal scarring and thickening left-greater-than-right. 3. No acute pulmonary disease. Electronically Signed   By: Ashley Royalty M.D.   On: 05/19/2016 03:51    No results found.  No results found.    Assessment and Plan: Patient Active Problem List   Diagnosis Date Noted  . Pneumonia due to Pseudomonas ([place]) 08/25/2014  . Sepsis ([place]) 08/24/2014    1. Obstructive chronic bronchitis without exacerbation (H[facility]) Severe disease. Continue breo as prescribed.    2. Very heavy cigarette smoker Smoking cessation counseling: 1. Pt acknowledges the risks of long term smoking, she will try to quite smoking. 2. Options for different medications including nicotine products, chewing gum, patch etc, Wellbutrin and Chantix is discussed 3. Goal and date of compete cessation is discussed 4. Total time spent in smoking cessation is 15  min.  3. Gastroesophageal reflux disease without esophagitis Continue to use  Omeprazole as directed.   4. SOB (shortness of breath) FEV1 0.8 which is 33% of pre-predicted value.  - Spirometry with Graph  General Counseling: I have discussed the findings of the evaluation and examination with [name].  I have also discussed any further diagnostic evaluation thatmay be needed or ordered today. Josselyn verbalizes understanding of the findings of todays visit. We also reviewed her medications today and discussed drug interactions and side effects including but not limited excessive drowsiness and altered mental states. We also discussed that there is always a risk not just to her but also people around her. she has been encouraged to call the office with any questions or concerns that should arise related to todays visit.  Orders Placed This Encounter  Procedures  . Spirometry with Graph    Order Specific Question:   Where should this test be performed?    Answer:   [facility]  Time spent: 30 This patient was seen by Orson Gear AGNP-C in Collaboration with Dr. Devona Konig as a part of collaborative care agreement.   I have personally obtained a history, examined the patient, evaluated laboratory and imaging results, formulated the assessment and plan and placed orders.    Allyne Gee, MD [facility] Pulmonary and Critical Care Sleep medicine

## 2019-08-23 ENCOUNTER — Telehealth: Payer: Self-pay

## 2019-08-23 NOTE — Telephone Encounter (Signed)
Called lmom informing patient of appointment on 08/27/2019. klh

## 2019-08-27 ENCOUNTER — Other Ambulatory Visit: Payer: Self-pay

## 2019-08-27 ENCOUNTER — Encounter: Payer: Self-pay | Admitting: Adult Health

## 2019-08-27 ENCOUNTER — Ambulatory Visit (INDEPENDENT_AMBULATORY_CARE_PROVIDER_SITE_OTHER): Payer: Medicare HMO | Admitting: Adult Health

## 2019-08-27 VITALS — BP 139/83 | HR 82 | Temp 97.3°F | Resp 16 | Ht 59.0 in | Wt 118.0 lb

## 2019-08-27 DIAGNOSIS — J449 Chronic obstructive pulmonary disease, unspecified: Secondary | ICD-10-CM | POA: Diagnosis not present

## 2019-08-27 DIAGNOSIS — Z7689 Persons encountering health services in other specified circumstances: Secondary | ICD-10-CM | POA: Diagnosis not present

## 2019-08-27 DIAGNOSIS — Z1231 Encounter for screening mammogram for malignant neoplasm of breast: Secondary | ICD-10-CM

## 2019-08-27 DIAGNOSIS — K219 Gastro-esophageal reflux disease without esophagitis: Secondary | ICD-10-CM

## 2019-08-27 DIAGNOSIS — Z13820 Encounter for screening for osteoporosis: Secondary | ICD-10-CM

## 2019-08-27 DIAGNOSIS — F17210 Nicotine dependence, cigarettes, uncomplicated: Secondary | ICD-10-CM

## 2019-08-27 NOTE — Progress Notes (Signed)
[facility] [place], [place] [address]  Internal MEDICINE  Office Visit Note  Patient Name: Kelly Curry  [id number]  [id number]  Date of Service: 08/27/2019  Chief Complaint  Patient presents with  . New Patient (Initial Visit)  . Quality Metric Gaps    needs physical     HPI  Pt is here to reestablish primary care.  She has not had a primary care physician over the last 5 years.  She has mostly gone to urgent care for her needs.  She does see Dr. Devona Konig for Pulmonary care.  She reports she has never had mammogram, and has been at least 20 years without a pap smear. She has a history of COPD and GERD.  She is a current smoker.     Current Medication: Outpatient Encounter Medications as of 08/27/2019  Medication Sig  . acetaminophen (TYLENOL) 500 MG tablet Take 500 mg by mouth every 4 (four) hours as needed.  Marland Kitchen albuterol (PROVENTIL) (2.5 MG/3ML) 0.083% nebulizer solution Take 3 mLs (2.5 mg total) by nebulization every 4 (four) hours as needed for wheezing or shortness of breath.  . docusate sodium (COLACE) 100 MG capsule Take 100 mg by mouth 2 (two) times daily.  . fluticasone furoate-vilanterol (BREO ELLIPTA) 100-25 MCG/INH AEPB Inhale 1 puff into the lungs daily.  Marland Kitchen omeprazole (PRILOSEC) 20 MG capsule Take 20 mg by mouth daily.  . [DISCONTINUED] acetaminophen (TYLENOL) 500 MG tablet Take 500 mg by mouth every 6 (six) hours as needed.  . [DISCONTINUED] Acetaminophen (TYLENOL EX ST ARTHRITIS PAIN PO) Take 500 mg by mouth. Take at bedtime to help with pain   No facility-administered encounter medications on file as of 08/27/2019.    Surgical History: Past Surgical History:  Procedure Laterality Date  . LUNG BIOPSY  2016  . TUBAL LIGATION      Medical History: Past Medical History:  Diagnosis Date  . 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 COPD (chronic obstructive pulmonary disease) ([place])   . Pneumonia   . Shortness of breath dyspnea      Family History: Family History  Problem Relation Age of Onset  . Breast cancer Mother   . COPD Father   . Emphysema Father     Social History   Socioeconomic History  . Marital status: Married    Spouse name: Not on file  . Number of children: Not on file  . Years of education: Not on file  . Highest education level: Not on file  Occupational History  . Not on file  Tobacco Use  . Smoking status: Current Every Day Smoker    Packs/day: 1.00    Years: 40.00    Pack years: 40.00  . Smokeless tobacco: Never Used  Substance and Sexual Activity  . Alcohol use: Yes    Alcohol/week: 7.0 standard drinks    Types: 7 Cans of beer per week    Comment: socially  . Drug use: No  . Sexual activity: Not on file  Other Topics Concern  . Not on file  Social History Narrative  . Not on file   Social Determinants of Health   Financial Resource Strain:   . Difficulty of Paying Living Expenses:   Food Insecurity:   . Worried About Charity fundraiser in the Last Year:   . Arboriculturist in the Last Year:   Transportation Needs:   . Film/video editor (Medical):   Marland Kitchen  Lack of Transportation (Non-Medical):   Physical Activity:   . Days of Exercise per Week:   . Minutes of Exercise per Session:   Stress:   . Feeling of Stress :   Social Connections:   . Frequency of Communication with Friends and Family:   . Frequency of Social Gatherings with Friends and Family:   . Attends Religious Services:   . Active Member of Clubs or Organizations:   . Attends Archivist Meetings:   Marland Kitchen Marital Status:   Intimate Partner Violence:   . Fear of Current or Ex-Partner:   . Emotionally Abused:   Marland Kitchen Physically Abused:   . Sexually Abused:       Review of Systems  Constitutional: Negative for chills, fatigue and unexpected weight change.  HENT: Negative for congestion, rhinorrhea, sneezing and sore throat.   Eyes: Negative for photophobia, pain and redness.  Respiratory:  Negative for cough, chest tightness and shortness of breath.   Cardiovascular: Negative for chest pain and palpitations.  Gastrointestinal: Negative for abdominal pain, constipation, diarrhea, nausea and vomiting.  Endocrine: Negative.   Genitourinary: Negative for dysuria and frequency.  Musculoskeletal: Negative for arthralgias, back pain, joint swelling and neck pain.  Skin: Negative for rash.  Allergic/Immunologic: Negative.   Neurological: Negative for tremors and numbness.  Hematological: Negative for adenopathy. Does not bruise/bleed easily.  Psychiatric/Behavioral: Negative for behavioral problems and sleep disturbance. The patient is not nervous/anxious.     Vital Signs: BP 139/83   Pulse 82   Temp (!) 97.3 F (36.3 C)   Resp 16   Ht 4\' 11"  (1.499 m)   Wt 118 lb (53.5 kg)   SpO2 95%   BMI 23.83 kg/m    Physical Exam Vitals and nursing note reviewed.  Constitutional:      General: She is not in acute distress.    Appearance: She is well-developed. She is not diaphoretic.  HENT:     Head: Normocephalic and atraumatic.     Mouth/Throat:     Pharynx: No oropharyngeal exudate.  Eyes:     Pupils: Pupils are equal, round, and reactive to light.  Neck:     Thyroid: No thyromegaly.     Vascular: No JVD.     Trachea: No tracheal deviation.  Cardiovascular:     Rate and Rhythm: Normal rate and regular rhythm.     Heart sounds: Normal heart sounds. No murmur. No friction rub. No gallop.   Pulmonary:     Effort: Pulmonary effort is normal. No respiratory distress.     Breath sounds: Normal breath sounds. No wheezing or rales.  Chest:     Chest wall: No tenderness.  Abdominal:     Palpations: Abdomen is soft.     Tenderness: There is no abdominal tenderness. There is no guarding.  Musculoskeletal:        General: Normal range of motion.     Cervical back: Normal range of motion and neck supple.  Lymphadenopathy:     Cervical: No cervical adenopathy.  Skin:     General: Skin is warm and dry.  Neurological:     Mental Status: She is alert and oriented to person, place, and time.     Cranial Nerves: No cranial nerve deficit.  Psychiatric:        Behavior: Behavior normal.        Thought Content: Thought content normal.        Judgment: Judgment normal.    Assessment/Plan: 1. Establishing  care with new doctor, encounter for Have Baseline lab studies done.  - CBC with Differential/Platelet - Lipid Panel With LDL/HDL Ratio - TSH - T4, free - Comprehensive metabolic panel - S97 and Folate Panel - Vitamin D (25 hydroxy) - Fe+TIBC+Fer  2. Gastroesophageal reflux disease without esophagitis Using omeprazole.   3. Obstructive chronic bronchitis without exacerbation ([place]) Using Breo with good results.  Continue current meds and dosing.  4. Very heavy cigarette smoker Smoking cessation counseling: 1. Pt acknowledges the risks of long term smoking, she will try to quite smoking. 2. Options for different medications including nicotine products, chewing gum, patch etc, Wellbutrin and Chantix is discussed 3. Goal and date of compete cessation is discussed 4. Total time spent in smoking cessation is 15 min.  5. Encounter for screening mammogram for breast cancer - MM DIGITAL SCREENING BILATERAL; Future  6. Screening for osteoporosis - DG Bone Density; Future  General Counseling: jalena vanderlinden understanding of the findings of todays visit and agrees with plan of treatment. I have discussed any further diagnostic evaluation that may be needed or ordered today. We also reviewed her medications today. she has been encouraged to call the office with any questions or concerns that should arise related to todays visit.    Orders Placed This Encounter  Procedures  . DG Bone Density  . MM DIGITAL SCREENING BILATERAL  . CBC with Differential/Platelet  . Lipid Panel With LDL/HDL Ratio  . TSH  . T4, free  . Comprehensive metabolic panel  . B12  and Folate Panel  . Vitamin D (25 hydroxy)  . Fe+TIBC+Fer    No orders of the defined types were placed in this encounter.   Time spent: 30 Minutes   This patient was seen by Orson Gear AGNP-C in Collaboration with Dr Lavera Guise as a part of collaborative care agreement     Kendell Bane AGNP-C Internal medicine

## 2019-09-05 DIAGNOSIS — H25013 Cortical age-related cataract, bilateral: Secondary | ICD-10-CM | POA: Diagnosis not present

## 2019-09-20 ENCOUNTER — Other Ambulatory Visit: Payer: Self-pay | Admitting: Adult Health

## 2019-09-20 ENCOUNTER — Ambulatory Visit
Admission: RE | Admit: 2019-09-20 | Discharge: 2019-09-20 | Disposition: A | Discharge status: Home / Self Care | Payer: Medicare HMO | Source: Ambulatory Visit | Attending: Adult Health | Admitting: Adult Health

## 2019-09-20 DIAGNOSIS — M810 Age-related osteoporosis without current pathological fracture: Secondary | ICD-10-CM | POA: Diagnosis not present

## 2019-09-20 DIAGNOSIS — Z780 Asymptomatic menopausal state: Secondary | ICD-10-CM | POA: Diagnosis not present

## 2019-09-20 DIAGNOSIS — Z1231 Encounter for screening mammogram for malignant neoplasm of breast: Secondary | ICD-10-CM | POA: Insufficient documentation

## 2019-09-20 DIAGNOSIS — N6310 Unspecified lump in the right breast, unspecified quadrant: Secondary | ICD-10-CM

## 2019-09-20 DIAGNOSIS — Z13820 Encounter for screening for osteoporosis: Secondary | ICD-10-CM | POA: Insufficient documentation

## 2019-09-20 DIAGNOSIS — R928 Other abnormal and inconclusive findings on diagnostic imaging of breast: Secondary | ICD-10-CM

## 2019-09-25 DIAGNOSIS — E559 Vitamin D deficiency, unspecified: Secondary | ICD-10-CM | POA: Diagnosis not present

## 2019-09-25 DIAGNOSIS — E782 Mixed hyperlipidemia: Secondary | ICD-10-CM | POA: Diagnosis not present

## 2019-09-25 DIAGNOSIS — E0789 Other specified disorders of thyroid: Secondary | ICD-10-CM | POA: Diagnosis not present

## 2019-09-25 DIAGNOSIS — Z7689 Persons encountering health services in other specified circumstances: Secondary | ICD-10-CM | POA: Diagnosis not present

## 2019-09-25 DIAGNOSIS — D510 Vitamin B12 deficiency anemia due to intrinsic factor deficiency: Secondary | ICD-10-CM | POA: Diagnosis not present

## 2019-09-25 DIAGNOSIS — D508 Other iron deficiency anemias: Secondary | ICD-10-CM | POA: Diagnosis not present

## 2019-09-26 LAB — COMPREHENSIVE METABOLIC PANEL
ALT: 14 IU/L (ref 0–32)
AST: 18 IU/L (ref 0–40)
Albumin/Globulin Ratio: 1.6 (ref 1.2–2.2)
Albumin: 4.2 g/dL (ref 3.8–4.8)
Alkaline Phosphatase: 103 IU/L (ref 48–121)
BUN/Creatinine Ratio: 12 (ref 12–28)
BUN: 11 mg/dL (ref 8–27)
Bilirubin Total: 0.3 mg/dL (ref 0.0–1.2)
CO2: 22 mmol/L (ref 20–29)
Calcium: 9.1 mg/dL (ref 8.7–10.3)
Chloride: 99 mmol/L (ref 96–106)
Creatinine, Ser: 0.9 mg/dL (ref 0.57–1.00)
GFR calc Af Amer: 78 mL/min/{1.73_m2} (ref >59)
GFR calc non Af Amer: 67 mL/min/{1.73_m2} (ref >59)
Globulin, Total: 2.6 g/dL (ref 1.5–4.5)
Glucose: 101 mg/dL — ABNORMAL HIGH (ref 65–99)
Potassium: 4.1 mmol/L (ref 3.5–5.2)
Sodium: 138 mmol/L (ref 134–144)
Total Protein: 6.8 g/dL (ref 6.0–8.5)

## 2019-09-26 LAB — LIPID PANEL WITH LDL/HDL RATIO
Cholesterol, Total: 295 mg/dL — ABNORMAL HIGH (ref 100–199)
HDL: 40 mg/dL (ref >39)
LDL Chol Calc (NIH): 218 mg/dL — ABNORMAL HIGH (ref 0–99)
LDL/HDL Ratio: 5.5 ratio — ABNORMAL HIGH (ref 0.0–3.2)
Triglycerides: 191 mg/dL — ABNORMAL HIGH (ref 0–149)
VLDL Cholesterol Cal: 37 mg/dL (ref 5–40)

## 2019-09-26 LAB — CBC WITH DIFFERENTIAL/PLATELET
Basophils Absolute: 0.2 10*3/uL (ref 0.0–0.2)
Basos: 2 %
EOS (ABSOLUTE): 0.3 10*3/uL (ref 0.0–0.4)
Eos: 3 %
Hematocrit: 36 % (ref 34.0–46.6)
Hemoglobin: 12 g/dL (ref 11.1–15.9)
Immature Grans (Abs): 0 10*3/uL (ref 0.0–0.1)
Immature Granulocytes: 0 %
Lymphocytes Absolute: 3.4 10*3/uL — ABNORMAL HIGH (ref 0.7–3.1)
Lymphs: 34 %
MCH: 29.3 pg (ref 26.6–33.0)
MCHC: 33.3 g/dL (ref 31.5–35.7)
MCV: 88 fL (ref 79–97)
Monocytes Absolute: 1.2 10*3/uL — ABNORMAL HIGH (ref 0.1–0.9)
Monocytes: 13 %
Neutrophils Absolute: 4.7 10*3/uL (ref 1.4–7.0)
Neutrophils: 48 %
Platelets: 331 10*3/uL (ref 150–450)
RBC: 4.09 x10E6/uL (ref 3.77–5.28)
RDW: 12.7 % (ref 11.7–15.4)
WBC: 9.8 10*3/uL (ref 3.4–10.8)

## 2019-09-26 LAB — TSH: TSH: 2.32 u[IU]/mL (ref 0.450–4.500)

## 2019-09-26 LAB — VITAMIN D 25 HYDROXY (VIT D DEFICIENCY, FRACTURES): Vit D, 25-Hydroxy: 13.6 ng/mL — ABNORMAL LOW (ref 30.0–100.0)

## 2019-09-26 LAB — IRON,TIBC AND FERRITIN PANEL
Ferritin: 260 ng/mL — ABNORMAL HIGH (ref 15–150)
Iron Saturation: 16 % (ref 15–55)
Iron: 44 ug/dL (ref 27–139)
Total Iron Binding Capacity: 280 ug/dL (ref 250–450)
UIBC: 236 ug/dL (ref 118–369)

## 2019-09-26 LAB — T4, FREE: Free T4: 1.23 ng/dL (ref 0.82–1.77)

## 2019-09-26 LAB — B12 AND FOLATE PANEL
Folate: 8 ng/mL (ref >3.0)
Vitamin B-12: 572 pg/mL (ref 232–1245)

## 2019-10-03 ENCOUNTER — Ambulatory Visit (INDEPENDENT_AMBULATORY_CARE_PROVIDER_SITE_OTHER): Payer: Medicare HMO | Admitting: Adult Health

## 2019-10-03 ENCOUNTER — Encounter: Payer: Self-pay | Admitting: Adult Health

## 2019-10-03 ENCOUNTER — Other Ambulatory Visit: Payer: Self-pay

## 2019-10-03 VITALS — BP 142/80 | HR 87 | Temp 97.3°F | Resp 16 | Ht 60.0 in | Wt 117.0 lb

## 2019-10-03 DIAGNOSIS — K219 Gastro-esophageal reflux disease without esophagitis: Secondary | ICD-10-CM | POA: Diagnosis not present

## 2019-10-03 DIAGNOSIS — F17210 Nicotine dependence, cigarettes, uncomplicated: Secondary | ICD-10-CM | POA: Diagnosis not present

## 2019-10-03 DIAGNOSIS — Z112 Encounter for screening for other bacterial diseases: Secondary | ICD-10-CM

## 2019-10-03 DIAGNOSIS — J449 Chronic obstructive pulmonary disease, unspecified: Secondary | ICD-10-CM | POA: Diagnosis not present

## 2019-10-03 DIAGNOSIS — Z124 Encounter for screening for malignant neoplasm of cervix: Secondary | ICD-10-CM

## 2019-10-03 DIAGNOSIS — Z0001 Encounter for general adult medical examination with abnormal findings: Secondary | ICD-10-CM | POA: Diagnosis not present

## 2019-10-03 DIAGNOSIS — R300 Dysuria: Secondary | ICD-10-CM

## 2019-10-03 DIAGNOSIS — E559 Vitamin D deficiency, unspecified: Secondary | ICD-10-CM | POA: Diagnosis not present

## 2019-10-03 MED ORDER — VITAMIN D (ERGOCALCIFEROL) 1.25 MG (50000 UNIT) PO CAPS: 50000.0000 [IU] | ORAL_CAPSULE | ORAL | 0 refills | Status: DC

## 2019-10-03 NOTE — Progress Notes (Signed)
[facility] [place],  [id number]  Internal MEDICINE  Office Visit Note  Patient Name: Kelly Curry  [id number]  [id number]  Date of Service: 10/03/2019  Chief Complaint  Patient presents with  . Medicare Wellness  . Gastroesophageal Reflux  . COPD  . Quality Metric Gaps    pna vacc     HPI Pt is here for routine health maintenance examination.  Pt is a well appearing 64 yo. She has a history of COPD, and GERD.  She is a heavy smoker.  She denies any problems or complaints at this time.      Current Medication: Outpatient Encounter Medications as of 10/03/2019  Medication Sig  . acetaminophen (TYLENOL) 500 MG tablet Take 500 mg by mouth every 4 (four) hours as needed.  Marland Kitchen albuterol (PROVENTIL) (2.5 MG/3ML) 0.083% nebulizer solution Take 3 mLs (2.5 mg total) by nebulization every 4 (four) hours as needed for wheezing or shortness of breath.  . docusate sodium (COLACE) 100 MG capsule Take 100 mg by mouth 2 (two) times daily.  . fluticasone furoate-vilanterol (BREO ELLIPTA) 100-25 MCG/INH AEPB Inhale 1 puff into the lungs daily.  Marland Kitchen omeprazole (PRILOSEC) 20 MG capsule Take 20 mg by mouth daily.   No facility-administered encounter medications on file as of 10/03/2019.    Surgical History: Past Surgical History:  Procedure Laterality Date  . LUNG BIOPSY  2016  . TUBAL LIGATION      Medical History: Past Medical History:  Diagnosis Date  . 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 COPD (chronic obstructive pulmonary disease) ([place])   . Pneumonia   . Shortness of breath dyspnea     Family History: Family History  Problem Relation Age of Onset  . Breast cancer Mother   . COPD Father   . Emphysema Father       Review of Systems  Constitutional: Negative for chills, fatigue and unexpected weight change.  HENT: Negative for congestion, rhinorrhea, sneezing and sore throat.   Eyes: Negative for photophobia, pain and redness.  Respiratory:  Negative for cough, chest tightness and shortness of breath.   Cardiovascular: Negative for chest pain and palpitations.  Gastrointestinal: Negative for abdominal pain, constipation, diarrhea, nausea and vomiting.  Endocrine: Negative.   Genitourinary: Negative for dysuria and frequency.  Musculoskeletal: Negative for arthralgias, back pain, joint swelling and neck pain.  Skin: Negative for rash.  Allergic/Immunologic: Negative.   Neurological: Negative for tremors and numbness.  Hematological: Negative for adenopathy. Does not bruise/bleed easily.  Psychiatric/Behavioral: Negative for behavioral problems and sleep disturbance. The patient is not nervous/anxious.      Vital Signs: BP (!) 175/87   Pulse 87   Temp (!) 97.3 F (36.3 C)   Resp 16   Ht 5' (1.524 m)   Wt 117 lb (53.1 kg)   SpO2 97%   BMI 22.85 kg/m    Physical Exam Vitals and nursing note reviewed.  Constitutional:      General: She is not in acute distress.    Appearance: She is well-developed. She is not diaphoretic.  HENT:     Head: Normocephalic and atraumatic.     Mouth/Throat:     Pharynx: No oropharyngeal exudate.  Eyes:     Pupils: Pupils are equal, round, and reactive to light.  Neck:     Thyroid: No thyromegaly.     Vascular: No JVD.     Trachea: No tracheal deviation.  Cardiovascular:  Rate and Rhythm: Normal rate and regular rhythm.     Heart sounds: Normal heart sounds. No murmur. No friction rub. No gallop.   Pulmonary:     Effort: Pulmonary effort is normal. No respiratory distress.     Breath sounds: Normal breath sounds. No wheezing or rales.  Chest:     Chest wall: No tenderness.     Breasts:        Right: Normal.        Left: Normal.     Comments: Exam Chaperoned by Corlis Hove CMA   Abdominal:     Palpations: Abdomen is soft.     Tenderness: There is no abdominal tenderness. There is no guarding.  Musculoskeletal:        General: Normal range of motion.     Cervical  back: Normal range of motion and neck supple.  Lymphadenopathy:     Cervical: No cervical adenopathy.  Skin:    General: Skin is warm and dry.  Neurological:     Mental Status: She is alert and oriented to person, place, and time.     Cranial Nerves: No cranial nerve deficit.  Psychiatric:        Behavior: Behavior normal.        Thought Content: Thought content normal.        Judgment: Judgment normal.      LABS: Recent Results (from the past 2160 hour(s))  CBC with Differential/Platelet     Status: Abnormal   Collection Time: 09/25/19  9:23 AM  Result Value Ref Range   WBC 9.8 3.4 - 10.8 x10E3/uL   RBC 4.09 3.77 - 5.28 x10E6/uL   Hemoglobin 12.0 11.1 - 15.9 g/dL   Hematocrit 36.0 34.0 - 46.6 %   MCV 88 79 - 97 fL   MCH 29.3 26.6 - 33.0 pg   MCHC 33.3 31.5 - 35.7 g/dL   RDW 12.7 11.7 - 15.4 %   Platelets 331 150 - 450 x10E3/uL   Neutrophils 48 Not Estab. %   Lymphs 34 Not Estab. %   Monocytes 13 Not Estab. %   Eos 3 Not Estab. %   Basos 2 Not Estab. %   Neutrophils Absolute 4.7 1.4 - 7.0 x10E3/uL   Lymphocytes Absolute 3.4 (H) 0.7 - 3.1 x10E3/uL   Monocytes Absolute 1.2 (H) 0.1 - 0.9 x10E3/uL   EOS (ABSOLUTE) 0.3 0.0 - 0.4 x10E3/uL   Basophils Absolute 0.2 0.0 - 0.2 x10E3/uL   Immature Granulocytes 0 Not Estab. %   Immature Grans (Abs) 0.0 0.0 - 0.1 x10E3/uL  Lipid Panel With LDL/HDL Ratio     Status: Abnormal   Collection Time: 09/25/19  9:23 AM  Result Value Ref Range   Cholesterol, Total 295 (H) 100 - 199 mg/dL   Triglycerides 191 (H) 0 - 149 mg/dL   HDL 40 >39 mg/dL   VLDL Cholesterol Cal 37 5 - 40 mg/dL   LDL Chol Calc (NIH) 218 (H) 0 - 99 mg/dL   LDL/HDL Ratio 5.5 (H) 0.0 - 3.2 ratio    Comment:                                     LDL/HDL Ratio  Men  Women                               1/2 Avg.Risk  1.0    1.5                                   Avg.Risk  3.6    3.2                                2X Avg.Risk  6.2     5.0                                3X Avg.Risk  8.0    6.1   TSH     Status: None   Collection Time: 09/25/19  9:23 AM  Result Value Ref Range   TSH 2.320 0.450 - 4.500 uIU/mL  T4, free     Status: None   Collection Time: 09/25/19  9:23 AM  Result Value Ref Range   Free T4 1.23 0.82 - 1.77 ng/dL  Comprehensive metabolic panel     Status: Abnormal   Collection Time: 09/25/19  9:23 AM  Result Value Ref Range   Glucose 101 (H) 65 - 99 mg/dL   BUN 11 8 - 27 mg/dL   Creatinine, Ser 0.90 0.57 - 1.00 mg/dL   GFR calc non Af Amer 67 >59 mL/min/1.73   GFR calc Af Amer 78 >59 mL/min/1.73    Comment: **[facility] currently reports eGFR in compliance with the current**   recommendations of the [employer]. [facility] will   update reporting as new guidelines are published from the NKF-ASN   Task force.    BUN/Creatinine Ratio 12 12 - 28   Sodium 138 134 - 144 mmol/L   Potassium 4.1 3.5 - 5.2 mmol/L   Chloride 99 96 - 106 mmol/L   CO2 22 20 - 29 mmol/L   Calcium 9.1 8.7 - 10.3 mg/dL   Total Protein 6.8 6.0 - 8.5 g/dL   Albumin 4.2 3.8 - 4.8 g/dL   Globulin, Total 2.6 1.5 - 4.5 g/dL   Albumin/Globulin Ratio 1.6 1.2 - 2.2   Bilirubin Total 0.3 0.0 - 1.2 mg/dL   Alkaline Phosphatase 103 48 - 121 IU/L   AST 18 0 - 40 IU/L   ALT 14 0 - 32 IU/L  B12 and Folate Panel     Status: None   Collection Time: 09/25/19  9:23 AM  Result Value Ref Range   Vitamin B-12 572 232 - 1,245 pg/mL   Folate 8.0 >3.0 ng/mL    Comment: A serum folate concentration of less than 3.1 ng/mL is considered to represent clinical deficiency.   Vitamin D (25 hydroxy)     Status: Abnormal   Collection Time: 09/25/19  9:23 AM  Result Value Ref Range   Vit D, 25-Hydroxy 13.6 (L) 30.0 - 100.0 ng/mL    Comment: Vitamin D deficiency has been defined by the [place] practice guideline as a level of serum 25-OH vitamin D less than 20 ng/mL (1,2). The Endocrine Society went  on to further define vitamin D insufficiency as a level between 21 and 29 ng/mL (2). 1. IOM (Institute of Medicine).  2010. Dietary reference    intakes for calcium and D. [place]: The    [employer]. 2. Holick MF, Binkley [place], Bischoff-Ferrari HA, et al.    Evaluation, treatment, and prevention of vitamin D    deficiency: an Endocrine Society clinical practice    guideline. JCEM. 2011 Jul; 96(7):1911-30.   Fe+TIBC+Fer     Status: Abnormal   Collection Time: 09/25/19  9:23 AM  Result Value Ref Range   Total Iron Binding Capacity 280 250 - 450 ug/dL   UIBC 236 118 - 369 ug/dL   Iron 44 27 - 139 ug/dL   Iron Saturation 16 15 - 55 %   Ferritin 260 (H) 15 - 150 ng/mL    Assessment/Plan: 1. Encounter for general adult medical examination with abnormal findings Discussed care gaps,  2. Obstructive chronic bronchitis without exacerbation (HCC) Current stable, severe disease, continue present management.  3. Gastroesophageal reflux disease without esophagitis Controlled, continue current medication  4. Very heavy cigarette smoker Smoking cessation counseling: 1. Pt acknowledges the risks of long term smoking, she will try to quite smoking. 2. Options for different medications including nicotine products, chewing gum, patch etc, Wellbutrin and Chantix is discussed 3. Goal and date of compete cessation is discussed 4. Total time spent in smoking cessation is 15 min.   5. Dysuria - Urinalysis, Routine w reflex microscopic  6. Routine cervical smear - IGP, Aptima HPV  7. Vitamin D deficiency Take Vit D as discussed - Vitamin D, Ergocalciferol, (DRISDOL) 1.25 MG (50000 UNIT) CAPS capsule; Take 1 capsule (50,000 Units total) by mouth every 7 (seven) days.  Dispense: 8 capsule; Refill: 0  General Counseling: [place] verbalizes understanding of the findings of todays visit and agrees with plan of treatment. I have discussed any further diagnostic evaluation that may  be needed or ordered today. We also reviewed her medications today. she has been encouraged to call the office with any questions or concerns that should arise related to todays visit.   Orders Placed This Encounter  Procedures  . Urinalysis, Routine w reflex microscopic    No orders of the defined types were placed in this encounter.   Time spent: 30 Minutes   This patient was seen by Orson Gear AGNP-C in Collaboration with Dr Lavera Guise as a part of collaborative care agreement    Kendell Bane AGNP-C Internal Medicine

## 2019-10-04 LAB — URINALYSIS, ROUTINE W REFLEX MICROSCOPIC
Bilirubin, UA: NEGATIVE
Glucose, UA: NEGATIVE
Ketones, UA: NEGATIVE
Leukocytes,UA: NEGATIVE
Nitrite, UA: NEGATIVE
Protein,UA: NEGATIVE
RBC, UA: NEGATIVE
Specific Gravity, UA: 1.009 (ref 1.005–1.030)
Urobilinogen, Ur: 0.2 mg/dL (ref 0.2–1.0)
pH, UA: 6 (ref 5.0–7.5)

## 2019-10-08 LAB — IGP, APTIMA HPV: HPV Aptima: NEGATIVE

## 2019-10-09 ENCOUNTER — Ambulatory Visit
Admission: RE | Admit: 2019-10-09 | Discharge: 2019-10-09 | Disposition: A | Discharge status: Home / Self Care | Payer: Medicare HMO | Source: Ambulatory Visit | Attending: Adult Health | Admitting: Adult Health

## 2019-10-09 DIAGNOSIS — R928 Other abnormal and inconclusive findings on diagnostic imaging of breast: Secondary | ICD-10-CM | POA: Insufficient documentation

## 2019-10-09 DIAGNOSIS — N6310 Unspecified lump in the right breast, unspecified quadrant: Secondary | ICD-10-CM | POA: Insufficient documentation

## 2019-10-09 DIAGNOSIS — N6312 Unspecified lump in the right breast, upper inner quadrant: Secondary | ICD-10-CM | POA: Diagnosis not present

## 2019-10-09 LAB — NUSWAB VAGINITIS PLUS (VG+)
Candida albicans, NAA: NEGATIVE
Candida glabrata, NAA: NEGATIVE
Chlamydia trachomatis, NAA: NEGATIVE
Neisseria gonorrhoeae, NAA: NEGATIVE
Trich vag by NAA: NEGATIVE

## 2019-10-10 ENCOUNTER — Telehealth: Payer: Self-pay

## 2019-10-10 NOTE — Telephone Encounter (Signed)
Pt advised pap came back normal

## 2019-10-10 NOTE — Telephone Encounter (Signed)
-----   Message from Kendell Bane, NP sent at 10/08/2019  8:19 AM EDT ----- Normal pap

## 2019-11-01 ENCOUNTER — Telehealth: Payer: Self-pay

## 2019-11-01 NOTE — Telephone Encounter (Signed)
Called lmom informing patient of appointment on 11/05/2019. klh

## 2019-11-05 ENCOUNTER — Ambulatory Visit: Payer: Medicare HMO | Admitting: Internal Medicine

## 2019-11-05 ENCOUNTER — Other Ambulatory Visit: Payer: Self-pay

## 2019-11-05 ENCOUNTER — Encounter: Payer: Self-pay | Admitting: Internal Medicine

## 2019-11-05 ENCOUNTER — Other Ambulatory Visit: Payer: Self-pay | Admitting: Adult Health

## 2019-11-05 VITALS — BP 166/78 | HR 60 | Temp 97.6°F | Resp 16 | Ht 59.0 in | Wt 116.4 lb

## 2019-11-05 DIAGNOSIS — T7840XD Allergy, unspecified, subsequent encounter: Secondary | ICD-10-CM

## 2019-11-05 DIAGNOSIS — K219 Gastro-esophageal reflux disease without esophagitis: Secondary | ICD-10-CM | POA: Diagnosis not present

## 2019-11-05 DIAGNOSIS — J449 Chronic obstructive pulmonary disease, unspecified: Secondary | ICD-10-CM | POA: Diagnosis not present

## 2019-11-05 DIAGNOSIS — F17210 Nicotine dependence, cigarettes, uncomplicated: Secondary | ICD-10-CM

## 2019-11-05 MED ORDER — IBANDRONATE SODIUM 150 MG PO TABS
150.0000 mg | ORAL_TABLET | ORAL | 11 refills | Status: DC
Start: 2019-11-05 — End: 2020-10-09

## 2019-11-05 NOTE — Progress Notes (Signed)
[facility] [address], [place] [address]  Pulmonary Sleep Medicine   Office Visit Note  Patient Name: Kelly Curry DOB: Dec 19, 1955 MRN [id number]  Date of Service: 11/05/2019  Complaints/HPI: Pt is here for pulmonary follow up.  She reports over the last 3 days her allergies have been flared up but overall she is doing well.  She continues to use her Breo daily.  Unfortunately she continues to smoke cigarettes.      ROS  General: (-) fever, (-) chills, (-) night sweats, (-) weakness Skin: (-) rashes, (-) itching,. Eyes: (-) visual changes, (-) redness, (-) itching. Nose and Sinuses: (-) nasal stuffiness or itchiness, (-) postnasal drip, (-) nosebleeds, (-) sinus trouble. Mouth and Throat: (-) sore throat, (-) hoarseness. Neck: (-) swollen glands, (-) enlarged thyroid, (-) neck pain. Respiratory: - cough, (-) bloody sputum, - shortness of breath, - wheezing. Cardiovascular: - ankle swelling, (-) chest pain. Lymphatic: (-) lymph node enlargement. Neurologic: (-) numbness, (-) tingling. Psychiatric: (-) anxiety, (-) depression   Current Medication: Outpatient Encounter Medications as of 11/05/2019  Medication Sig  . acetaminophen (TYLENOL) 500 MG tablet Take 500 mg by mouth every 4 (four) hours as needed.  Marland Kitchen albuterol (PROVENTIL) (2.5 MG/3ML) 0.083% nebulizer solution Take 3 mLs (2.5 mg total) by nebulization every 4 (four) hours as needed for wheezing or shortness of breath.  . docusate sodium (COLACE) 100 MG capsule Take 100 mg by mouth 2 (two) times daily.  . fluticasone furoate-vilanterol (BREO ELLIPTA) 100-25 MCG/INH AEPB Inhale 1 puff into the lungs daily.  Marland Kitchen omeprazole (PRILOSEC) 20 MG capsule Take 20 mg by mouth daily.  . Vitamin D, Ergocalciferol, (DRISDOL) 1.25 MG (50000 UNIT) CAPS capsule Take 1 capsule (50,000 Units total) by mouth every 7 (seven) days.   No facility-administered encounter medications on file as of 11/05/2019.    Surgical  History: Past Surgical History:  Procedure Laterality Date  . LUNG BIOPSY  2016  . TUBAL LIGATION      Medical History: Past Medical History:  Diagnosis Date  . 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 COPD (chronic obstructive pulmonary disease) ([place])   . Pneumonia   . Shortness of breath dyspnea     Family History: Family History  Problem Relation Age of Onset  . Breast cancer Mother   . COPD Father   . Emphysema Father     Social History: Social History   Socioeconomic History  . Marital status: Married    Spouse name: Not on file  . Number of children: Not on file  . Years of education: Not on file  . Highest education level: Not on file  Occupational History  . Not on file  Tobacco Use  . Smoking status: Current Every Day Smoker    Packs/day: 1.00    Years: 40.00    Pack years: 40.00    Types: Cigarettes  . Smokeless tobacco: Never Used  Substance and Sexual Activity  . Alcohol use: Yes    Alcohol/week: 7.0 standard drinks    Types: 7 Cans of beer per week    Comment: socially  . Drug use: No  . Sexual activity: Not on file  Other Topics Concern  . Not on file  Social History Narrative  . Not on file   Social Determinants of Health   Financial Resource Strain:   . Difficulty of Paying Living Expenses:   Food Insecurity:   . Worried About Charity fundraiser  in the Last Year:   . [place] in the Last Year:   Transportation Needs:   . Film/video editor (Medical):   Marland Kitchen Lack of Transportation (Non-Medical):   Physical Activity:   . Days of Exercise per Week:   . Minutes of Exercise per Session:   Stress:   . Feeling of Stress :   Social Connections:   . Frequency of Communication with Friends and Family:   . Frequency of Social Gatherings with Friends and Family:   . Attends Religious Services:   . Active Member of Clubs or Organizations:   . Attends Archivist Meetings:   Marland Kitchen Marital Status:   Intimate Partner Violence:   .  Fear of Current or Ex-Partner:   . Emotionally Abused:   Marland Kitchen Physically Abused:   . Sexually Abused:     Vital Signs: Blood pressure (!) 166/78, pulse 60, temperature 97.6 F (36.4 C), resp. rate 16, height _0  (1.499 m), weight 116 lb 6.4 oz (52.8 kg), SpO2 96 %.  Examination: General Appearance: The patient is well-developed, well-nourished, and in no distress. Skin: Gross inspection of skin unremarkable. Head: normocephalic, no gross deformities. Eyes: no gross deformities noted. ENT: ears appear grossly normal no exudates. Neck: Supple. No thyromegaly. No LAD. Respiratory: clear bilaterally. Cardiovascular: Normal S1 and S2 without murmur or rub. Extremities: No cyanosis. pulses are equal. Neurologic: Alert and oriented. No involuntary movements.  LABS: Recent Results (from the past 2160 hour(s))  CBC with Differential/Platelet     Status: Abnormal   Collection Time: 09/25/19  9:23 AM  Result Value Ref Range   WBC 9.8 3.4 - 10.8 x10E3/uL   RBC 4.09 3.77 - 5.28 x10E6/uL   Hemoglobin 12.0 11.1 - 15.9 g/dL   Hematocrit 36.0 34.0 - 46.6 %   MCV 88 79 - 97 fL   MCH 29.3 26.6 - 33.0 pg   MCHC 33.3 31 - 35 g/dL   RDW 12.7 11.7 - 15.4 %   Platelets 331 150 - 450 x10E3/uL   Neutrophils 48 Not Estab. %   Lymphs 34 Not Estab. %   Monocytes 13 Not Estab. %   Eos 3 Not Estab. %   Basos 2 Not Estab. %   Neutrophils Absolute 4.7 1 - 7 x10E3/uL   Lymphocytes Absolute 3.4 (H) 0 - 3 x10E3/uL   Monocytes Absolute 1.2 (H) 0 - 0 x10E3/uL   EOS (ABSOLUTE) 0.3 0.0 - 0.4 x10E3/uL   Basophils Absolute 0.2 0 - 0 x10E3/uL   Immature Granulocytes 0 Not Estab. %   Immature Grans (Abs) 0.0 0.0 - 0.1 x10E3/uL  Lipid Panel With LDL/HDL Ratio     Status: Abnormal   Collection Time: 09/25/19  9:23 AM  Result Value Ref Range   Cholesterol, Total 295 (H) 100 - 199 mg/dL   Triglycerides 191 (H) 0 - 149 mg/dL   HDL 40 >39 mg/dL   VLDL Cholesterol Cal 37 5 - 40 mg/dL   LDL Chol Calc (NIH) 218  (H) 0 - 99 mg/dL   LDL/HDL Ratio 5.5 (H) 0.0 - 3.2 ratio    Comment:                                     LDL/HDL Ratio  Men  Women                               1/2 Avg.Risk  1.0    1.5                                   Avg.Risk  3.6    3.2                                2X Avg.Risk  6.2    5.0                                3X Avg.Risk  8.0    6.1   TSH     Status: None   Collection Time: 09/25/19  9:23 AM  Result Value Ref Range   TSH 2.320 0.450 - 4.500 uIU/mL  T4, free     Status: None   Collection Time: 09/25/19  9:23 AM  Result Value Ref Range   Free T4 1.23 0.82 - 1.77 ng/dL  Comprehensive metabolic panel     Status: Abnormal   Collection Time: 09/25/19  9:23 AM  Result Value Ref Range   Glucose 101 (H) 65 - 99 mg/dL   BUN 11 8 - 27 mg/dL   Creatinine, Ser 0.90 0.57 - 1.00 mg/dL   GFR calc non Af Amer 67 >59 mL/min/1.73   GFR calc Af Amer 78 >59 mL/min/1.73    Comment: **[facility] currently reports eGFR in compliance with the current**   recommendations of the [employer]. [facility] will   update reporting as new guidelines are published from the NKF-ASN   Task force.    BUN/Creatinine Ratio 12 12 - 28   Sodium 138 134 - 144 mmol/L   Potassium 4.1 3.5 - 5.2 mmol/L   Chloride 99 96 - 106 mmol/L   CO2 22 20 - 29 mmol/L   Calcium 9.1 8.7 - 10.3 mg/dL   Total Protein 6.8 6.0 - 8.5 g/dL   Albumin 4.2 3.8 - 4.8 g/dL   Globulin, Total 2.6 1.5 - 4.5 g/dL   Albumin/Globulin Ratio 1.6 1.2 - 2.2   Bilirubin Total 0.3 0.0 - 1.2 mg/dL   Alkaline Phosphatase 103 48 - 121 IU/L   AST 18 0 - 40 IU/L   ALT 14 0 - 32 IU/L  B12 and Folate Panel     Status: None   Collection Time: 09/25/19  9:23 AM  Result Value Ref Range   Vitamin B-12 572 232 - 1,245 pg/mL   Folate 8.0 >3.0 ng/mL    Comment: A serum folate concentration of less than 3.1 ng/mL is considered to represent clinical deficiency.   Vitamin D (25 hydroxy)      Status: Abnormal   Collection Time: 09/25/19  9:23 AM  Result Value Ref Range   Vit D, 25-Hydroxy 13.6 (L) 30.0 - 100.0 ng/mL    Comment: Vitamin D deficiency has been defined by the [place] practice guideline as a level of serum 25-OH vitamin D less than 20 ng/mL (1,2). The Endocrine Society went on to further define vitamin D insufficiency as a level between 21 and 29 ng/mL (2). 1. IOM (Institute of Medicine). 2010.  Dietary reference    intakes for calcium and D. [place]: The    [employer]. 2. Holick MF, Binkley , Bischoff-Ferrari HA, et al.    Evaluation, treatment, and prevention of vitamin D    deficiency: an Endocrine Society clinical practice    guideline. JCEM. 2011 Jul; 96(7):1911-30.   Fe+TIBC+Fer     Status: Abnormal   Collection Time: 09/25/19  9:23 AM  Result Value Ref Range   Total Iron Binding Capacity 280 250 - 450 ug/dL   UIBC 236 118 - 369 ug/dL   Iron 44 27 - 139 ug/dL   Iron Saturation 16 15 - 55 %   Ferritin 260 (H) 15.0 - 150.0 ng/mL  NuSwab Vaginitis Plus (VG+)     Status: None   Collection Time: 10/03/19 12:00 AM  Result Value Ref Range   Atopobium vaginae Low - 0 Score   BVAB 2 Low - 0 Score   Megasphaera 1 Low - 0 Score    Comment: Calculate total score by adding the 3 individual bacterial vaginosis (BV) marker scores together.  Total score is interpreted as follows: Total score 0-1: Indicates the absence of BV. Total score   2: Indeterminate for BV. Additional clinical                  data should be evaluated to establish a                  diagnosis. Total score 3-6: Indicates the presence of BV. This test was developed and its performance characteristics determined by [facility].  It has not been cleared or approved by the Food and Drug Administration.    Candida albicans, NAA Negative Negative   Candida glabrata, NAA Negative Negative   Trich vag by NAA Negative Negative   Chlamydia  trachomatis, NAA Negative Negative   Neisseria gonorrhoeae, NAA Negative Negative  Urinalysis, Routine w reflex microscopic     Status: None   Collection Time: 10/03/19  8:29 AM  Result Value Ref Range   Specific Gravity, UA 1.009 1.005 - 1.030   pH, UA 6.0 5.0 - 7.5   Color, UA Yellow Yellow   Appearance Ur Clear Clear   Leukocytes,UA Negative Negative   Protein,UA Negative Negative/Trace   Glucose, UA Negative Negative   Ketones, UA Negative Negative   RBC, UA Negative Negative   Bilirubin, UA Negative Negative   Urobilinogen, Ur 0.2 0.2 - 1.0 mg/dL   Nitrite, UA Negative Negative   Microscopic Examination Comment     Comment: Microscopic not indicated and not performed.  IGP, Aptima HPV     Status: None   Collection Time: 10/03/19  8:29 AM  Result Value Ref Range   Interpretation UNS     Comment: UNSATISFACTORY FOR EVALUATION.   Category UNS     Comment: Unsatisfactory   Adequacy USSBB     Comment: Specimen processed and examined, but unsatisfactory for evaluation of epithelial abnormality because of insufficient squamous cellularity.    Clinician Provided ICD10 Comment     Comment: Z12.4   Performed by: Comment     Comment: Raiford Noble, Cytotechnologist (ASCP)   QC reviewed by: Comment     Comment: Schkita Black, Cytotechnologist (ASCP)   Note: Comment     Comment: The Pap smear is a screening test designed to aid in the detection of premalignant and malignant conditions of the uterine cervix.  It is not a diagnostic procedure and should not be used as  the sole means of detecting cervical cancer.  Both false-positive and false-negative reports do occur.    Test Methodology CANCELED     Comment: The Thin Prep(R) Imager was unable to read this specimen.  Therefore a manual review was performed.  Result canceled by the ancillary.    HPV Aptima Negative Negative    Comment: This nucleic acid amplification test detects fourteen high-risk HPV types  (16,18,31,33,35,39,45,51,52,56,58,59,66,68) without differentiation.     Radiology: US BREAST LTD UNI RIGHT INC AXILLA  Result Date: 10/09/2019 CLINICAL DATA:  Screening recall from baseline mammography for a 1.3 cm oval circumscribed mass in the upper inner right breast. EXAM: DIGITAL DIAGNOSTIC RIGHT MAMMOGRAM WITH CAD AND TOMO ULTRASOUND RIGHT BREAST COMPARISON:  Screening mammogram dated 09/20/2019 ACR Breast Density Category b: There are scattered areas of fibroglandular density. FINDINGS: Spot compression tomograms were performed of the upper inner right breast demonstrating an oval circumscribed 1.3 cm mass. Mammographic images were processed with CAD. Targeted ultrasound of the upper right breast was performed. There is an oval circumscribed hypoechoic mass at the 1 o'clock position 3 cm from nipple measuring 0.9 x 0.5 x 1.2 cm. This corresponds well with the mass seen in the upper right breast at mammography. This is also felt to correspond well with an oval circumscribed mass seen in the superior right breast on chest CT dated 09/06/2014 and likely stable however difficult to compare due to different modalities. IMPRESSION: Probably benign right breast mass. RECOMMENDATION: Right breast ultrasound in 6 months. I have discussed the findings and recommendations with the patient. If applicable, a reminder letter will be sent to the patient regarding the next appointment. BI-RADS CATEGORY  3: Probably benign. Electronically Signed   By: Everlean Alstrom M.D.   On: 10/09/2019 10:06   MM DIAG BREAST TOMO UNI RIGHT  Result Date: 10/09/2019 CLINICAL DATA:  Screening recall from baseline mammography for a 1.3 cm oval circumscribed mass in the upper inner right breast. EXAM: DIGITAL DIAGNOSTIC RIGHT MAMMOGRAM WITH CAD AND TOMO ULTRASOUND RIGHT BREAST COMPARISON:  Screening mammogram dated 09/20/2019 ACR Breast Density Category b: There are scattered areas of fibroglandular density. FINDINGS: Spot  compression tomograms were performed of the upper inner right breast demonstrating an oval circumscribed 1.3 cm mass. Mammographic images were processed with CAD. Targeted ultrasound of the upper right breast was performed. There is an oval circumscribed hypoechoic mass at the 1 o'clock position 3 cm from nipple measuring 0.9 x 0.5 x 1.2 cm. This corresponds well with the mass seen in the upper right breast at mammography. This is also felt to correspond well with an oval circumscribed mass seen in the superior right breast on chest CT dated 09/06/2014 and likely stable however difficult to compare due to different modalities. IMPRESSION: Probably benign right breast mass. RECOMMENDATION: Right breast ultrasound in 6 months. I have discussed the findings and recommendations with the patient. If applicable, a reminder letter will be sent to the patient regarding the next appointment. BI-RADS CATEGORY  3: Probably benign. Electronically Signed   By: Everlean Alstrom M.D.   On: 10/09/2019 10:06    No results found.  US BREAST LTD UNI RIGHT INC AXILLA  Result Date: 10/09/2019 CLINICAL DATA:  Screening recall from baseline mammography for a 1.3 cm oval circumscribed mass in the upper inner right breast. EXAM: DIGITAL DIAGNOSTIC RIGHT MAMMOGRAM WITH CAD AND TOMO ULTRASOUND RIGHT BREAST COMPARISON:  Screening mammogram dated 09/20/2019 ACR Breast Density Category b: There are scattered areas of fibroglandular density.  FINDINGS: Spot compression tomograms were performed of the upper inner right breast demonstrating an oval circumscribed 1.3 cm mass. Mammographic images were processed with CAD. Targeted ultrasound of the upper right breast was performed. There is an oval circumscribed hypoechoic mass at the 1 o'clock position 3 cm from nipple measuring 0.9 x 0.5 x 1.2 cm. This corresponds well with the mass seen in the upper right breast at mammography. This is also felt to correspond well with an oval circumscribed  mass seen in the superior right breast on chest CT dated 09/06/2014 and likely stable however difficult to compare due to different modalities. IMPRESSION: Probably benign right breast mass. RECOMMENDATION: Right breast ultrasound in 6 months. I have discussed the findings and recommendations with the patient. If applicable, a reminder letter will be sent to the patient regarding the next appointment. BI-RADS CATEGORY  3: Probably benign. Electronically Signed   By: Everlean Alstrom M.D.   On: 10/09/2019 10:06   MM DIAG BREAST TOMO UNI RIGHT  Result Date: 10/09/2019 CLINICAL DATA:  Screening recall from baseline mammography for a 1.3 cm oval circumscribed mass in the upper inner right breast. EXAM: DIGITAL DIAGNOSTIC RIGHT MAMMOGRAM WITH CAD AND TOMO ULTRASOUND RIGHT BREAST COMPARISON:  Screening mammogram dated 09/20/2019 ACR Breast Density Category b: There are scattered areas of fibroglandular density. FINDINGS: Spot compression tomograms were performed of the upper inner right breast demonstrating an oval circumscribed 1.3 cm mass. Mammographic images were processed with CAD. Targeted ultrasound of the upper right breast was performed. There is an oval circumscribed hypoechoic mass at the 1 o'clock position 3 cm from nipple measuring 0.9 x 0.5 x 1.2 cm. This corresponds well with the mass seen in the upper right breast at mammography. This is also felt to correspond well with an oval circumscribed mass seen in the superior right breast on chest CT dated 09/06/2014 and likely stable however difficult to compare due to different modalities. IMPRESSION: Probably benign right breast mass. RECOMMENDATION: Right breast ultrasound in 6 months. I have discussed the findings and recommendations with the patient. If applicable, a reminder letter will be sent to the patient regarding the next appointment. BI-RADS CATEGORY  3: Probably benign. Electronically Signed   By: Everlean Alstrom M.D.   On: 10/09/2019 10:06       Assessment and Plan: Patient Active Problem List   Diagnosis Date Noted  . COPD (chronic obstructive pulmonary disease) ([place]) 07/14/2017  . Pneumonia due to Pseudomonas ([place]) 08/25/2014  . Sepsis ([place]) 08/24/2014    1. Obstructive chronic bronchitis without exacerbation ([place]) Stable, continue to use Breo as instructed.   2. Very heavy cigarette smoker Smoking cessation counseling: 1. Pt acknowledges the risks of long term smoking, she will try to quite smoking. 2. Options for different medications including nicotine products, chewing gum, patch etc, Wellbutrin and Chantix is discussed 3. Goal and date of compete cessation is discussed 4. Total time spent in smoking cessation is 15 min.  3. Gastroesophageal reflux disease without esophagitis Stable, continue GERD management.   4. Allergy, subsequent encounter Continue current management.   General Counseling: I have discussed the findings of the evaluation and examination with [name].  I have also discussed any further diagnostic evaluation thatmay be needed or ordered today. Jazmene verbalizes understanding of the findings of todays visit. We also reviewed her medications today and discussed drug interactions and side effects including but not limited excessive drowsiness and altered mental states. We also discussed that there is always a risk  not just to her but also people around her. she has been encouraged to call the office with any questions or concerns that should arise related to todays visit.  No orders of the defined types were placed in this encounter.    Time spent: 30 This patient was seen by Orson Gear AGNP-C in Collaboration with Dr. Devona Konig as a part of collaborative care agreement.   I have personally obtained a history, examined the patient, evaluated laboratory and imaging results, formulated the assessment and plan and placed orders.    Allyne Gee, MD [facility] Pulmonary and Critical  Care Sleep medicine

## 2019-11-05 NOTE — Progress Notes (Signed)
Bone density shows osteoporosis and osteopenia.  Sent RX for [employer].

## 2019-11-15 ENCOUNTER — Other Ambulatory Visit: Payer: Self-pay | Admitting: Adult Health

## 2019-11-15 DIAGNOSIS — E559 Vitamin D deficiency, unspecified: Secondary | ICD-10-CM

## 2019-11-19 ENCOUNTER — Telehealth: Payer: Self-pay

## 2019-11-19 ENCOUNTER — Other Ambulatory Visit: Payer: Self-pay

## 2019-11-19 DIAGNOSIS — R0602 Shortness of breath: Secondary | ICD-10-CM

## 2019-11-19 MED ORDER — BREO ELLIPTA 100-25 MCG/INH IN AEPB: 1.0000 | INHALATION_SPRAY | Freq: Every day | RESPIRATORY_TRACT | 5 refills | Status: DC

## 2019-11-19 NOTE — Telephone Encounter (Signed)
done

## 2019-11-19 NOTE — Telephone Encounter (Signed)
-----   Message from Lavera Guise, MD sent at 11/19/2019  8:16 AM EDT ----- Can u make sure, she takes calcium and Vit D every day OTC since she is on boniva

## 2019-12-04 ENCOUNTER — Telehealth: Payer: Self-pay

## 2019-12-04 NOTE — Telephone Encounter (Signed)
Spoke to pt and she informed me that she is taking the high dose of Vitamin D, so I asked for pt to start taking a daily dose of OTC calcium per [facility].  dbs

## 2020-01-02 ENCOUNTER — Other Ambulatory Visit: Payer: Self-pay | Admitting: Adult Health

## 2020-01-02 DIAGNOSIS — E559 Vitamin D deficiency, unspecified: Secondary | ICD-10-CM

## 2020-01-16 ENCOUNTER — Telehealth: Payer: Self-pay

## 2020-01-16 NOTE — Telephone Encounter (Signed)
Faxed requested medical records to [facility] at [phone].

## 2020-03-13 ENCOUNTER — Other Ambulatory Visit: Payer: Self-pay | Admitting: Adult Health

## 2020-03-13 ENCOUNTER — Other Ambulatory Visit: Payer: Self-pay

## 2020-03-13 DIAGNOSIS — N6310 Unspecified lump in the right breast, unspecified quadrant: Secondary | ICD-10-CM

## 2020-03-13 DIAGNOSIS — E559 Vitamin D deficiency, unspecified: Secondary | ICD-10-CM

## 2020-03-13 MED ORDER — VITAMIN D (ERGOCALCIFEROL) 1.25 MG (50000 UNIT) PO CAPS: 50000.0000 [IU] | ORAL_CAPSULE | ORAL | 1 refills | Status: DC

## 2020-04-02 ENCOUNTER — Other Ambulatory Visit: Payer: Self-pay

## 2020-04-02 ENCOUNTER — Ambulatory Visit (INDEPENDENT_AMBULATORY_CARE_PROVIDER_SITE_OTHER): Payer: Medicare HMO | Admitting: Hospice and Palliative Medicine

## 2020-04-02 ENCOUNTER — Encounter: Payer: Self-pay | Admitting: Hospice and Palliative Medicine

## 2020-04-02 VITALS — BP 110/90 | HR 102 | Temp 97.9°F | Resp 16 | Ht 60.0 in | Wt 117.0 lb

## 2020-04-02 DIAGNOSIS — F17219 Nicotine dependence, cigarettes, with unspecified nicotine-induced disorders: Secondary | ICD-10-CM | POA: Diagnosis not present

## 2020-04-02 DIAGNOSIS — R0989 Other specified symptoms and signs involving the circulatory and respiratory systems: Secondary | ICD-10-CM | POA: Diagnosis not present

## 2020-04-02 DIAGNOSIS — N6310 Unspecified lump in the right breast, unspecified quadrant: Secondary | ICD-10-CM

## 2020-04-02 DIAGNOSIS — E782 Mixed hyperlipidemia: Secondary | ICD-10-CM | POA: Diagnosis not present

## 2020-04-02 NOTE — Progress Notes (Signed)
[facility] [place], [place] [address]  Internal MEDICINE  Office Visit Note  Patient Name: Kelly Curry  [id number]  [id number]  Date of Service: 04/06/2020  Chief Complaint  Patient presents with  . Follow-up    6 month  . mammogram    needs referal for appt    HPI Patient is here for routine follow-up Has recently established herself with PCP--avoided health care offices in the past, went about 30 years without any exam or testing Things have been going well since her last visit about 6 months ago She mentions that she gets overwhelmed easily, would like to take things slow as far as getting herself up to date with testing and proper evaluation  Needs follow-up right breast US from previous US in June of this year for 3 cm hypoechoic mass found in upper right breast Completed [facility] testing 2020--normal Reviewed labs from June--abnormal lipid panel, she felt as though her levels were borderline--total CHO 295, LDL 218, triglycerides 191 Discussed her levels are elevated and further risk factor for CVA/MI is that she continues to smoke about 1/2 pack cigarettes per day Adamantly discusses she does not want to take anymore medication   Current Medication: Outpatient Encounter Medications as of 04/02/2020  Medication Sig  . acetaminophen (TYLENOL) 500 MG tablet Take 500 mg by mouth every 4 (four) hours as needed.  . docusate sodium (COLACE) 100 MG capsule Take 100 mg by mouth 2 (two) times daily.  . fluticasone furoate-vilanterol (BREO ELLIPTA) 100-25 MCG/INH AEPB Inhale 1 puff into the lungs daily.  Marland Kitchen ibandronate (BONIVA) 150 MG tablet Take 1 tablet (150 mg total) by mouth every 30 (thirty) days. Take in the morning with a full glass of water, on an empty stomach, and do not take anything else by mouth or lie down for the next 30 min.  Marland Kitchen omeprazole (PRILOSEC) 20 MG capsule Take 20 mg by mouth daily.  . Vitamin D, Ergocalciferol, (DRISDOL) 1.25  MG (50000 UNIT) CAPS capsule Take 1 capsule (50,000 Units total) by mouth once a week.  Marland Kitchen albuterol (PROVENTIL) (2.5 MG/3ML) 0.083% nebulizer solution Take 3 mLs (2.5 mg total) by nebulization every 4 (four) hours as needed for wheezing or shortness of breath. (Patient not taking: Reported on 04/02/2020)   No facility-administered encounter medications on file as of 04/02/2020.    Surgical History: Past Surgical History:  Procedure Laterality Date  . LUNG BIOPSY  2016  . TUBAL LIGATION      Medical History: Past Medical History:  Diagnosis Date  . 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 COPD (chronic obstructive pulmonary disease) ([place])   . Pneumonia   . Shortness of breath dyspnea     Family History: Family History  Problem Relation Age of Onset  . Breast cancer Mother   . COPD Father   . Emphysema Father     Social History   Socioeconomic History  . Marital status: Married    Spouse name: Not on file  . Number of children: Not on file  . Years of education: Not on file  . Highest education level: Not on file  Occupational History  . Not on file  Tobacco Use  . Smoking status: Current Every Day Smoker    Packs/day: 1.00    Years: 40.00    Pack years: 40.00    Types: Cigarettes  . Smokeless tobacco: Never Used  Substance and Sexual Activity  . Alcohol use: Yes  Alcohol/week: 7.0 standard drinks    Types: 7 Cans of beer per week    Comment: socially  . Drug use: No  . Sexual activity: Not on file  Other Topics Concern  . Not on file  Social History Narrative  . Not on file   Social Determinants of Health   Financial Resource Strain: Not on file  Food Insecurity: Not on file  Transportation Needs: Not on file  Physical Activity: Not on file  Stress: Not on file  Social Connections: Not on file  Intimate Partner Violence: Not on file      Review of Systems  Constitutional: Negative for chills, diaphoresis and fatigue.  HENT: Negative for ear pain,  postnasal drip and sinus pressure.   Eyes: Negative for photophobia, discharge, redness, itching and visual disturbance.  Respiratory: Negative for cough, shortness of breath and wheezing.   Cardiovascular: Negative for chest pain, palpitations and leg swelling.  Gastrointestinal: Negative for abdominal pain, constipation, diarrhea, nausea and vomiting.  Genitourinary: Negative for dysuria and flank pain.  Musculoskeletal: Negative for arthralgias, back pain, gait problem and neck pain.  Skin: Negative for color change.  Allergic/Immunologic: Negative for environmental allergies and food allergies.  Neurological: Negative for dizziness and headaches.  Hematological: Does not bruise/bleed easily.  Psychiatric/Behavioral: Negative for agitation, behavioral problems (depression) and hallucinations.    Vital Signs: BP 110/90   Pulse (!) 102   Temp 97.9 F (36.6 C)   Resp 16   Ht 5' (1.524 m)   Wt 117 lb (53.1 kg)   SpO2 97%   BMI 22.85 kg/m    Physical Exam Vitals reviewed.  Constitutional:      Appearance: Normal appearance. She is normal weight.  Neck:     Vascular: Carotid bruit present.  Cardiovascular:     Rate and Rhythm: Normal rate and regular rhythm.     Pulses: Normal pulses.     Heart sounds: Normal heart sounds.  Pulmonary:     Effort: Pulmonary effort is normal.     Breath sounds: Normal breath sounds.  Abdominal:     General: Abdomen is flat.     Palpations: Abdomen is soft.  Musculoskeletal:        General: Normal range of motion.     Cervical back: Normal range of motion.  Skin:    General: Skin is warm.  Neurological:     General: No focal deficit present.     Mental Status: She is alert and oriented to person, place, and time. Mental status is at baseline.  Psychiatric:        Mood and Affect: Mood normal.        Behavior: Behavior normal.        Thought Content: Thought content normal.        Judgment: Judgment normal.    Assessment/Plan: 1.  Bilateral carotid bruits Review carotid US results and adjust plan of care as indicarted - VAS US CAROTID; Future  2. Breast mass, right 6 month [place] follow-up for upper right breast mass found in June - US BREAST LTD UNI RIGHT INC AXILLA  3. Mixed hyperlipidemia Discussed risk of CVA/MI, advised to focus on healthy eating habits and smoking cessation Advised based on carotid US results--will strongly encourage statin therapy  4. Cigarette nicotine dependence with nicotine-induced disorder Smoking cessation counseling: 1. Pt acknowledges the risks of long term smoking, she will try to quite smoking. 2. Options for different medications including nicotine products, chewing gum, patch etc,  Wellbutrin and Chantix is discussed 3. Goal and date of compete cessation is discussed 4. Total time spent in smoking cessation is 15 min.   General Counseling: Kelly Curry understanding of the findings of todays visit and agrees with plan of treatment. I have discussed any further diagnostic evaluation that may be needed or ordered today. We also reviewed her medications today. she has been encouraged to call the office with any questions or concerns that should arise related to todays visit.    Orders Placed This Encounter  Procedures  . VAS US CAROTID      Time spent: 30 Minutes Time spent includes review of chart, medications, test results and follow-up plan with the patient.  This patient was seen by Theodoro Grist AGNP-C in Collaboration with Dr Lavera Guise as a part of collaborative care agreement     Tanna Furry. Khalon Cansler AGNP-C Internal medicine

## 2020-04-03 ENCOUNTER — Other Ambulatory Visit: Payer: Self-pay | Admitting: Internal Medicine

## 2020-04-03 DIAGNOSIS — R928 Other abnormal and inconclusive findings on diagnostic imaging of breast: Secondary | ICD-10-CM

## 2020-04-03 DIAGNOSIS — N6310 Unspecified lump in the right breast, unspecified quadrant: Secondary | ICD-10-CM

## 2020-04-03 DIAGNOSIS — R0989 Other specified symptoms and signs involving the circulatory and respiratory systems: Secondary | ICD-10-CM

## 2020-04-06 ENCOUNTER — Encounter: Payer: Self-pay | Admitting: Hospice and Palliative Medicine

## 2020-04-14 ENCOUNTER — Other Ambulatory Visit: Payer: Self-pay

## 2020-04-15 ENCOUNTER — Other Ambulatory Visit: Payer: Self-pay

## 2020-04-15 DIAGNOSIS — E559 Vitamin D deficiency, unspecified: Secondary | ICD-10-CM

## 2020-04-15 MED ORDER — VITAMIN D (ERGOCALCIFEROL) 1.25 MG (50000 UNIT) PO CAPS: 50000.0000 [IU] | ORAL_CAPSULE | ORAL | 2 refills | Status: DC

## 2020-04-21 ENCOUNTER — Other Ambulatory Visit: Payer: Self-pay

## 2020-04-21 ENCOUNTER — Ambulatory Visit
Admission: RE | Admit: 2020-04-21 | Discharge: 2020-04-21 | Disposition: A | Discharge status: Home / Self Care | Payer: Medicare HMO | Source: Ambulatory Visit | Attending: Internal Medicine | Admitting: Internal Medicine

## 2020-04-21 DIAGNOSIS — R928 Other abnormal and inconclusive findings on diagnostic imaging of breast: Secondary | ICD-10-CM

## 2020-04-21 DIAGNOSIS — N6310 Unspecified lump in the right breast, unspecified quadrant: Secondary | ICD-10-CM | POA: Insufficient documentation

## 2020-04-21 DIAGNOSIS — N6312 Unspecified lump in the right breast, upper inner quadrant: Secondary | ICD-10-CM | POA: Diagnosis not present

## 2020-04-30 ENCOUNTER — Ambulatory Visit: Payer: Medicare HMO

## 2020-04-30 ENCOUNTER — Other Ambulatory Visit: Payer: Self-pay

## 2020-04-30 DIAGNOSIS — I6523 Occlusion and stenosis of bilateral carotid arteries: Secondary | ICD-10-CM

## 2020-05-05 ENCOUNTER — Other Ambulatory Visit: Payer: Self-pay

## 2020-05-05 ENCOUNTER — Ambulatory Visit: Payer: Medicare HMO | Admitting: Hospice and Palliative Medicine

## 2020-05-05 ENCOUNTER — Encounter: Payer: Self-pay | Admitting: Internal Medicine

## 2020-05-05 VITALS — BP 130/80 | HR 80 | Temp 97.8°F | Resp 16 | Ht 60.0 in | Wt 117.0 lb

## 2020-05-05 DIAGNOSIS — R0602 Shortness of breath: Secondary | ICD-10-CM

## 2020-05-05 DIAGNOSIS — F17219 Nicotine dependence, cigarettes, with unspecified nicotine-induced disorders: Secondary | ICD-10-CM

## 2020-05-05 DIAGNOSIS — K219 Gastro-esophageal reflux disease without esophagitis: Secondary | ICD-10-CM

## 2020-05-05 DIAGNOSIS — Z122 Encounter for screening for malignant neoplasm of respiratory organs: Secondary | ICD-10-CM

## 2020-05-05 DIAGNOSIS — J449 Chronic obstructive pulmonary disease, unspecified: Secondary | ICD-10-CM

## 2020-05-05 DIAGNOSIS — J4489 Other specified chronic obstructive pulmonary disease: Secondary | ICD-10-CM

## 2020-05-05 NOTE — Patient Instructions (Signed)
Chronic Obstructive Pulmonary Disease  Chronic obstructive pulmonary disease (COPD) is a long-term (chronic) lung problem. When you have COPD, it is hard for air to get in and out of your lungs. Usually the condition gets worse over time, and your lungs will never return to normal. There are things you can do to keep yourself as healthy as possible. What are the causes?  Smoking. This is the most common cause.  Certain genes passed from parent to child (inherited). What increases the risk?  Being exposed to secondhand smoke from cigarettes, pipes, or cigars.  Being exposed to chemicals and other irritants, such as fumes and dust in the work environment.  Having chronic lung conditions or infections. What are the signs or symptoms?  Shortness of breath, especially during physical activity.  A long-term cough with a large amount of thick mucus. Sometimes, the cough may not have any mucus (dry cough).  Wheezing.  Breathing quickly.  Skin that looks gray or blue, especially in the fingers, toes, or lips.  Feeling tired (fatigue).  Weight loss.  Chest tightness.  Having infections often.  Episodes when breathing symptoms become much worse (exacerbations). At the later stages of this disease, you may have swelling in the ankles, feet, or legs. How is this treated?  Taking medicines.  Quitting smoking, if you smoke.  Rehabilitation. This includes steps to make your body work better. It may involve a team of specialists.  Doing exercises.  Making changes to your diet.  Using oxygen.  Lung surgery.  Lung transplant.  Comfort measures (palliative care). Follow these instructions at home: Medicines  Take over-the-counter and prescription medicines only as told by your doctor.  Talk to your doctor before taking any cough or allergy medicines. You may need to avoid medicines that cause your lungs to be dry. Lifestyle  If you smoke, stop smoking. Smoking makes the  problem worse.  Do not smoke or use any products that contain nicotine or tobacco. If you need help quitting, ask your doctor.  Avoid being around things that make your breathing worse. This may include smoke, chemicals, and fumes.  Stay active, but remember to rest as well.  Learn and use tips on how to manage stress and control your breathing.  Make sure you get enough sleep. Most adults need at least 7 hours of sleep every night.  Eat healthy foods. Eat smaller meals more often. Rest before meals. Controlled breathing Learn and use tips on how to control your breathing as told by your doctor. Try:  Breathing in (inhaling) through your nose for 1 second. Then, pucker your lips and breath out (exhale) through your lips for 2 seconds.  Putting one hand on your belly (abdomen). Breathe in slowly through your nose for 1 second. Your hand on your belly should move out. Pucker your lips and breathe out slowly through your lips. Your hand on your belly should move in as you breathe out.   Controlled coughing Learn and use controlled coughing to clear mucus from your lungs. Follow these steps: 1. Lean your head a little forward. 2. Breathe in deeply. 3. Try to hold your breath for 3 seconds. 4. Keep your mouth slightly open while coughing 2 times. 5. Spit any mucus out into a tissue. 6. Rest and do the steps again 1 or 2 times as needed. General instructions  Make sure you get all the shots (vaccines) that your doctor recommends. Ask your doctor about a flu shot and a pneumonia shot.    Use oxygen therapy and pulmonary rehabilitation if told by your doctor. If you need home oxygen therapy, ask your doctor if you should buy a tool to measure your oxygen level (oximeter).  Make a COPD action plan with your doctor. This helps you to know what to do if you feel worse than usual.  Manage any other conditions you have as told by your doctor.  Avoid going outside when it is very hot, cold, or  humid.  Avoid people who have a sickness you can catch (contagious).  Keep all follow-up visits. Contact a doctor if:  You cough up more mucus than usual.  There is a change in the color or thickness of the mucus.  It is harder to breathe than usual.  Your breathing is faster than usual.  You have trouble sleeping.  You need to use your medicines more often than usual.  You have trouble doing your normal activities such as getting dressed or walking around the house. Get help right away if:  You have shortness of breath while resting.  You have shortness of breath that stops you from: ? Being able to talk. ? Doing normal activities.  Your chest hurts for longer than 5 minutes.  Your skin color is more blue than usual.  Your pulse oximeter shows that you have low oxygen for longer than 5 minutes.  You have a fever.  You feel too tired to breathe normally. These symptoms may represent a serious problem that is an emergency. Do not wait to see if the symptoms will go away. Get medical help right away. Call your local emergency services (911 in the [place] Do not drive yourself to the hospital. Summary  Chronic obstructive pulmonary disease (COPD) is a long-term lung problem.  The way your lungs work will never return to normal. Usually the condition gets worse over time. There are things you can do to keep yourself as healthy as possible.  Take over-the-counter and prescription medicines only as told by your doctor.  If you smoke, stop. Smoking makes the problem worse. This information is not intended to replace advice given to you by your health care provider. Make sure you discuss any questions you have with your health care provider. Document Revised: 02/19/2020 Document Reviewed: 02/19/2020 Elsevier Patient Education  2021 Elsevier Inc.   

## 2020-05-05 NOTE — Progress Notes (Signed)
[facility] [place], [place] [address]  Pulmonary Sleep Medicine   Office Visit Note  Patient Name: Kelly Curry DOB: 23-Jul-1955 MRN [id number]  Date of Service: 05/05/2020  Complaints/HPI: Patient is here for routine pulmonary follow-up Followed for COPD, continues to use Breo inhaler daily, mostly controlling her symptoms, some days she feels the relief does not last all day and will use her albuterol inhaler Feels as though her breathing has remained the same since our last visit, no recent exacerbations or hospitalizations Last PFT 10/2018 severe obstructive lung disease CXR 2018, IMPRESSION: 1. Stable nodule the left mid lung noted be in the superior segment of the left lower lobe by prior CT. 2. Chronic stable pleuroparenchymal scarring and thickening left-greater-than-right. 3. No acute pulmonary disease. She is able to complete minimal housework before becoming short of breath but recovers fairly quickly after a short rest break Unfortunately she does continue to smoke about a pack per day   ROS  General: (-) fever, (-) chills, (-) night sweats, (-) weakness Skin: (-) rashes, (-) itching,. Eyes: (-) visual changes, (-) redness, (-) itching. Nose and Sinuses: (-) nasal stuffiness or itchiness, (-) postnasal drip, (-) nosebleeds, (-) sinus trouble. Mouth and Throat: (-) sore throat, (-) hoarseness. Neck: (-) swollen glands, (-) enlarged thyroid, (-) neck pain. Respiratory: - cough, (-) bloody sputum, + shortness of breath, - wheezing. Cardiovascular: - ankle swelling, (-) chest pain. Lymphatic: (-) lymph node enlargement. Neurologic: (-) numbness, (-) tingling. Psychiatric: (-) anxiety, (-) depression   Current Medication: Outpatient Encounter Medications as of 05/05/2020  Medication Sig  . acetaminophen (TYLENOL) 500 MG tablet Take 500 mg by mouth every 4 (four) hours as needed.  Marland Kitchen albuterol (PROVENTIL) (2.5 MG/3ML) 0.083% nebulizer  solution Take 3 mLs (2.5 mg total) by nebulization every 4 (four) hours as needed for wheezing or shortness of breath.  . docusate sodium (COLACE) 100 MG capsule Take 100 mg by mouth 2 (two) times daily.  . fluticasone furoate-vilanterol (BREO ELLIPTA) 100-25 MCG/INH AEPB Inhale 1 puff into the lungs daily.  Marland Kitchen ibandronate (BONIVA) 150 MG tablet Take 1 tablet (150 mg total) by mouth every 30 (thirty) days. Take in the morning with a full glass of water, on an empty stomach, and do not take anything else by mouth or lie down for the next 30 min.  Marland Kitchen omeprazole (PRILOSEC) 20 MG capsule Take 20 mg by mouth daily.  . Vitamin D, Ergocalciferol, (DRISDOL) 1.25 MG (50000 UNIT) CAPS capsule Take 1 capsule (50,000 Units total) by mouth once a week.   No facility-administered encounter medications on file as of 05/05/2020.    Surgical History: Past Surgical History:  Procedure Laterality Date  . LUNG BIOPSY  2016  . TUBAL LIGATION      Medical History: Past Medical History:  Diagnosis Date  . 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 COPD (chronic obstructive pulmonary disease) ([place])   . Pneumonia   . Shortness of breath dyspnea     Family History: Family History  Problem Relation Age of Onset  . Breast cancer Mother   . COPD Father   . Emphysema Father     Social History: Social History   Socioeconomic History  . Marital status: Married    Spouse name: Not on file  . Number of children: Not on file  . Years of education: Not on file  . Highest education level: Not on file  Occupational History  . Not  on file  Tobacco Use  . Smoking status: Current Every Day Smoker    Packs/day: 1.00    Years: 40.00    Pack years: 40.00    Types: Cigarettes  . Smokeless tobacco: Never Used  Substance and Sexual Activity  . Alcohol use: Yes    Alcohol/week: 7.0 standard drinks    Types: 7 Cans of beer per week    Comment: socially  . Drug use: No  . Sexual activity: Not on file  Other Topics  Concern  . Not on file  Social History Narrative  . Not on file   Social Determinants of Health   Financial Resource Strain: Not on file  Food Insecurity: Not on file  Transportation Needs: Not on file  Physical Activity: Not on file  Stress: Not on file  Social Connections: Not on file  Intimate Partner Violence: Not on file    Vital Signs: Blood pressure 130/80, pulse 80, temperature 97.8 F (36.6 C), resp. rate 16, height 5' (1.524 m), weight 117 lb (53.1 kg), SpO2 96 %.  Examination: General Appearance: The patient is well-developed, well-nourished, and in no distress. Skin: Gross inspection of skin unremarkable. Head: normocephalic, no gross deformities. Eyes: no gross deformities noted. ENT: ears appear grossly normal no exudates. Neck: Supple. No thyromegaly. No LAD. Respiratory: Wheezing prominent in bilateral bases, no rhonchi or rales noted. Cardiovascular: Normal S1 and S2 without murmur or rub. Extremities: No cyanosis. pulses are equal. Neurologic: Alert and oriented. No involuntary movements.  LABS: No results found for this or any previous visit (from the past 2160 hour(s)).  Radiology: US BREAST LTD UNI RIGHT INC AXILLA  Result Date: 04/21/2020 CLINICAL DATA:  65 year old female for six-month follow-up of RIGHT breast mass. EXAM: ULTRASOUND OF THE RIGHT BREAST COMPARISON:  Previous exam(s). FINDINGS: Targeted ultrasound is performed, showing a stable 0.9 x 0.5 x 1.1 cm circumscribed oval hypoechoic parallel mass at the 1 o'clock position of the RIGHT breast 4 cm from the nipple. IMPRESSION: 1. Unchanged 1.1 cm UPPER INNER RIGHT breast mass. Six-month follow-up recommended to ensure 1 year stability. RECOMMENDATION: Bilateral diagnostic mammogram with RIGHT breast ultrasound in 6 months to resume annual mammogram schedule and to reassess likely benign RIGHT breast finding. I have discussed the findings and recommendations with the patient. If applicable, a  reminder letter will be sent to the patient regarding the next appointment. BI-RADS CATEGORY  3: Probably benign. Electronically Signed   By: Margarette Canada M.D.   On: 04/21/2020 15:45    No results found.  US BREAST LTD UNI RIGHT INC AXILLA  Result Date: 04/21/2020 CLINICAL DATA:  65 year old female for six-month follow-up of RIGHT breast mass. EXAM: ULTRASOUND OF THE RIGHT BREAST COMPARISON:  Previous exam(s). FINDINGS: Targeted ultrasound is performed, showing a stable 0.9 x 0.5 x 1.1 cm circumscribed oval hypoechoic parallel mass at the 1 o'clock position of the RIGHT breast 4 cm from the nipple. IMPRESSION: 1. Unchanged 1.1 cm UPPER INNER RIGHT breast mass. Six-month follow-up recommended to ensure 1 year stability. RECOMMENDATION: Bilateral diagnostic mammogram with RIGHT breast ultrasound in 6 months to resume annual mammogram schedule and to reassess likely benign RIGHT breast finding. I have discussed the findings and recommendations with the patient. If applicable, a reminder letter will be sent to the patient regarding the next appointment. BI-RADS CATEGORY  3: Probably benign. Electronically Signed   By: Margarette Canada M.D.   On: 04/21/2020 15:45      Assessment and Plan: Patient Active  Problem List   Diagnosis Date Noted  . COPD (chronic obstructive pulmonary disease) ([place]) 07/14/2017  . Pneumonia due to Pseudomonas ([place]) 08/25/2014  . Sepsis ([place]) 08/24/2014    1. Obstructive chronic bronchitis without exacerbation ([place]) Continue with Breo at this time, offered Trelegy but she has been on this and is unable to afford medication Will continue to monitor  2. SOB (shortness of breath) Severely decreased FEV1 0.7L, 36%--remains stable compared to previous spirometry in April - Spirometry with Graph  3. Encounter for screening for lung cancer Update imaging with low dose CT scan as she continue to smoke, nodules noted on previous imaging - CT CHEST LUNG CA SCREEN LOW DOSE W/O CM;  Future  4. Cigarette nicotine dependence with nicotine-induced disorder Again discussed importance of smoking cessation - CT CHEST LUNG CA SCREEN LOW DOSE W/O CM; Future  5. Gastroesophageal reflux disease without esophagitis Symptoms remain well controlled on omeprazole, continue to monitor  General Counseling: I have discussed the findings of the evaluation and examination with [name].  I have also discussed any further diagnostic evaluation thatmay be needed or ordered today. Sharanda verbalizes understanding of the findings of todays visit. We also reviewed her medications today and discussed drug interactions and side effects including but not limited excessive drowsiness and altered mental states. We also discussed that there is always a risk not just to her but also people around her. she has been encouraged to call the office with any questions or concerns that should arise related to todays visit.  Orders Placed This Encounter  Procedures  . CT CHEST LUNG CA SCREEN LOW DOSE W/O CM    Standing Status:   Future    Standing Expiration Date:   05/05/2021    Order Specific Question:   Reason for Exam (SYMPTOM  OR DIAGNOSIS REQUIRED)    Answer:   lung cancer screening    Order Specific Question:   Preferred Imaging Location?    Answer:   [facility]  . Spirometry with Graph    Order Specific Question:   Where should this test be performed?    Answer:   [facility]    Order Specific Question:   Basic spirometry    Answer:   Yes     Time spent: 30  I have personally obtained a history, examined the patient, evaluated laboratory and imaging results, formulated the assessment and plan and placed orders. This patient was seen by Casey Burkitt AGNP-C in Collaboration with Dr. Devona Konig as a part of collaborative care agreement.    Allyne Gee, MD [facility] Pulmonary and Critical Care Sleep medicine

## 2020-05-09 ENCOUNTER — Other Ambulatory Visit: Payer: Self-pay

## 2020-05-09 DIAGNOSIS — R911 Solitary pulmonary nodule: Secondary | ICD-10-CM

## 2020-05-21 ENCOUNTER — Other Ambulatory Visit: Payer: Self-pay

## 2020-05-21 ENCOUNTER — Ambulatory Visit
Admission: RE | Admit: 2020-05-21 | Discharge: 2020-05-21 | Disposition: A | Discharge status: Home / Self Care | Payer: Medicare HMO | Source: Ambulatory Visit | Attending: Hospice and Palliative Medicine | Admitting: Hospice and Palliative Medicine

## 2020-05-21 DIAGNOSIS — R911 Solitary pulmonary nodule: Secondary | ICD-10-CM

## 2020-05-21 DIAGNOSIS — J8410 Pulmonary fibrosis, unspecified: Secondary | ICD-10-CM | POA: Diagnosis not present

## 2020-05-21 DIAGNOSIS — I2510 Atherosclerotic heart disease of native coronary artery without angina pectoris: Secondary | ICD-10-CM | POA: Diagnosis not present

## 2020-05-21 DIAGNOSIS — J439 Emphysema, unspecified: Secondary | ICD-10-CM | POA: Diagnosis not present

## 2020-05-21 DIAGNOSIS — J984 Other disorders of lung: Secondary | ICD-10-CM | POA: Diagnosis not present

## 2020-05-25 NOTE — Procedures (Signed)
[place], [place] [address]  DATE OF SERVICE: April 30, 2020  CAROTID DOPPLER INTERPRETATION:  Bilateral Carotid Ultrsasound and Color Doppler Examination was performed. The RIGHT CCA shows no significant plaque in the vessel. The LEFT CCA shows no significant plaque in the vessel. There was no significant intimal thickening noted in the RIGHT carotid artery. There was no significant intimal thickening in the LEFT carotid artery.  The RIGHT CCA shows peak systolic velocity of 78 cm per second. The end diastolic velocity is 22 cm per second on the RIGHT side. The RIGHT ICA shows peak systolic velocity of 75 per second. RIGHT sided ICA end diastolic velocity is 27 cm per second. The RIGHT ECA shows a peak systolic velocity of 45 cm per second. The ICA/CCA ratio is calculated to be 0.96. This suggests an less than 50% stenosis. The Vertebral Artery shows antegrade flow.  The LEFT CCA shows peak systolic velocity of 70 cm per second. The end diastolic velocity is 19 cm per second on the LEFT side. The LEFT ICA shows peak systolic velocity of 75 per second. LEFT sided ICA end diastolic velocity is 23 cm per second. The LEFT ECA shows a peak systolic velocity of 55 cm per second. The ICA/CCA ratio is calculated to be 1.07. This suggests less than 50% stenosis. The Vertebral Artery shows antegrade flow.   Impression:    The RIGHT CAROTID shows less than 50% stenosis. The LEFT CAROTID shows less than 50% stenosis.  There is minimal to no significant plaque formation noted on the LEFT and no significant plaque on the RIGHT  side. Consider a repeat Carotid doppler if clinical situation and symptoms warrant in 6-12 months. Patient should be encouraged to change lifestyles such as smoking cessation, regular exercise and dietary modification. Use of statins in the right clinical setting and ASA is encouraged.  Allyne Gee, MD [facility] Pulmonary Critical Care Medicine

## 2020-05-27 ENCOUNTER — Ambulatory Visit: Payer: Medicare HMO | Admitting: Hospice and Palliative Medicine

## 2020-05-27 ENCOUNTER — Encounter: Payer: Self-pay | Admitting: Hospice and Palliative Medicine

## 2020-05-27 VITALS — BP 138/84 | HR 93 | Temp 97.5°F | Resp 16 | Ht 60.0 in | Wt 118.6 lb

## 2020-05-27 DIAGNOSIS — I700 Atherosclerosis of aorta: Secondary | ICD-10-CM | POA: Diagnosis not present

## 2020-05-27 DIAGNOSIS — N6310 Unspecified lump in the right breast, unspecified quadrant: Secondary | ICD-10-CM | POA: Diagnosis not present

## 2020-05-27 DIAGNOSIS — J8410 Pulmonary fibrosis, unspecified: Secondary | ICD-10-CM

## 2020-05-27 DIAGNOSIS — F17219 Nicotine dependence, cigarettes, with unspecified nicotine-induced disorders: Secondary | ICD-10-CM

## 2020-05-27 DIAGNOSIS — J439 Emphysema, unspecified: Secondary | ICD-10-CM

## 2020-05-27 MED ORDER — ATORVASTATIN CALCIUM 10 MG PO TABS: 10.0000 mg | ORAL_TABLET | Freq: Every day | ORAL | 3 refills | Status: DC

## 2020-05-27 NOTE — Progress Notes (Signed)
[facility] [place], [place] [address]  Internal MEDICINE  Office Visit Note  Patient Name: Kelly Curry  [id number]  [id number]  Date of Service: 05/29/2020  Chief Complaint  Patient presents with  . Follow-up    Review ultra sound    HPI Patient is here for routine follow-up Reviewed her CT chest--persistent scarring of left upper lobe, new area of fibrosis right upper lobe, CAD, aortic atherosclerosis, emphysema She does complain of shortness of breath with exertion--feels this may be worsening, able to complete a few tasks around her home and then she needs to sit down to rest Unfortunately she does continue to smoke about a pack per day Continues to use Breo daily and albuterol as needed   Current Medication: Outpatient Encounter Medications as of 05/27/2020  Medication Sig  . atorvastatin (LIPITOR) 10 MG tablet Take 1 tablet (10 mg total) by mouth daily.  Marland Kitchen acetaminophen (TYLENOL) 500 MG tablet Take 500 mg by mouth every 4 (four) hours as needed.  Marland Kitchen albuterol (PROVENTIL) (2.5 MG/3ML) 0.083% nebulizer solution Take 3 mLs (2.5 mg total) by nebulization every 4 (four) hours as needed for wheezing or shortness of breath.  . docusate sodium (COLACE) 100 MG capsule Take 100 mg by mouth 2 (two) times daily.  . fluticasone furoate-vilanterol (BREO ELLIPTA) 100-25 MCG/INH AEPB Inhale 1 puff into the lungs daily.  Marland Kitchen ibandronate (BONIVA) 150 MG tablet Take 1 tablet (150 mg total) by mouth every 30 (thirty) days. Take in the morning with a full glass of water, on an empty stomach, and do not take anything else by mouth or lie down for the next 30 min.  Marland Kitchen omeprazole (PRILOSEC) 20 MG capsule Take 20 mg by mouth daily.  . Vitamin D, Ergocalciferol, (DRISDOL) 1.25 MG (50000 UNIT) CAPS capsule Take 1 capsule (50,000 Units total) by mouth once a week.   No facility-administered encounter medications on file as of 05/27/2020.    Surgical History: Past Surgical  History:  Procedure Laterality Date  . LUNG BIOPSY  2016  . TUBAL LIGATION      Medical History: Past Medical History:  Diagnosis Date  . 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 COPD (chronic obstructive pulmonary disease) ([place])   . Pneumonia   . Shortness of breath dyspnea     Family History: Family History  Problem Relation Age of Onset  . Breast cancer Mother   . COPD Father   . Emphysema Father     Social History   Socioeconomic History  . Marital status: Married    Spouse name: Not on file  . Number of children: Not on file  . Years of education: Not on file  . Highest education level: Not on file  Occupational History  . Not on file  Tobacco Use  . Smoking status: Current Every Day Smoker    Packs/day: 1.00    Years: 40.00    Pack years: 40.00    Types: Cigarettes  . Smokeless tobacco: Never Used  Substance and Sexual Activity  . Alcohol use: Yes    Alcohol/week: 7.0 standard drinks    Types: 7 Cans of beer per week    Comment: socially  . Drug use: No  . Sexual activity: Not on file  Other Topics Concern  . Not on file  Social History Narrative  . Not on file   Social Determinants of Health   Financial Resource Strain: Not on file  Food Insecurity:  Not on file  Transportation Needs: Not on file  Physical Activity: Not on file  Stress: Not on file  Social Connections: Not on file  Intimate Partner Violence: Not on file      Review of Systems  Constitutional: Negative for chills, diaphoresis and fatigue.  HENT: Negative for ear pain, postnasal drip and sinus pressure.   Eyes: Negative for photophobia, discharge, redness, itching and visual disturbance.  Respiratory: Positive for cough and shortness of breath. Negative for wheezing.   Cardiovascular: Negative for chest pain, palpitations and leg swelling.  Gastrointestinal: Negative for abdominal pain, constipation, diarrhea, nausea and vomiting.  Genitourinary: Negative for dysuria and flank  pain.  Musculoskeletal: Negative for arthralgias, back pain, gait problem and neck pain.  Skin: Negative for color change.  Allergic/Immunologic: Negative for environmental allergies and food allergies.  Neurological: Negative for dizziness and headaches.  Hematological: Does not bruise/bleed easily.  Psychiatric/Behavioral: Negative for agitation, behavioral problems (depression) and hallucinations.    Vital Signs: BP 138/84   Pulse 93   Temp (!) 97.5 F (36.4 C)   Resp 16   Ht 5' (1.524 m)   Wt 118 lb 9.6 oz (53.8 kg)   SpO2 99%   BMI 23.16 kg/m    Physical Exam Vitals reviewed.  Constitutional:      Appearance: Normal appearance. She is normal weight.  Cardiovascular:     Rate and Rhythm: Normal rate and regular rhythm.     Pulses: Normal pulses.     Heart sounds: Normal heart sounds.  Pulmonary:     Effort: Pulmonary effort is normal.     Breath sounds: Examination of the right-lower field reveals decreased breath sounds and wheezing. Examination of the left-lower field reveals decreased breath sounds and wheezing. Decreased breath sounds and wheezing present.  Abdominal:     General: Abdomen is flat.  Musculoskeletal:        General: Normal range of motion.     Cervical back: Normal range of motion.  Skin:    General: Skin is warm.  Neurological:     General: No focal deficit present.     Mental Status: She is alert and oriented to person, place, and time. Mental status is at baseline.  Psychiatric:        Mood and Affect: Mood normal.        Behavior: Behavior normal.        Thought Content: Thought content normal.        Judgment: Judgment normal.    Assessment/Plan: 1. Aortic atherosclerosis ([place]) Start atorvastatin and advised to start 81 mg ASA daily - atorvastatin (LIPITOR) 10 MG tablet; Take 1 tablet (10 mg total) by mouth daily.  Dispense: 90 tablet; Refill: 3  2. Pulmonary emphysema, unspecified emphysema type ([place]) Continue with Breo at this  time--consider adjusting therpay  3. Pulmonary fibrosis ([place]) Order form given for sputum culture, AFB and CBC Will need pulmonary follow-up to discuss results  4. Cigarette nicotine dependence with nicotine-induced disorder Smoking cessation counseling: 1. Pt acknowledges the risks of long term smoking, she will try to quite smoking. 2. Options for different medications including nicotine products, chewing gum, patch etc, Wellbutrin and Chantix is discussed 3. Goal and date of compete cessation is discussed 4. Total time spent in smoking cessation is 15 min.   5. Breast mass, right Will need diagnostic mammogram with right breath [place] in May 2022 for surveillance  General Counseling: Michele Mcalpine understanding of the findings of todays visit and  agrees with plan of treatment. I have discussed any further diagnostic evaluation that may be needed or ordered today. We also reviewed her medications today. she has been encouraged to call the office with any questions or concerns that should arise related to todays visi  Meds ordered this encounter  Medications  . atorvastatin (LIPITOR) 10 MG tablet    Sig: Take 1 tablet (10 mg total) by mouth daily.    Dispense:  90 tablet    Refill:  3    Time spent: 30 Minutes Time spent includes review of chart, medications, test results and follow-up plan with the patient.  This patient was seen by Theodoro Grist AGNP-C in Collaboration with Dr Lavera Guise as a part of collaborative care agreement     Tanna Furry. Mckell Riecke AGNP-C Internal medicine

## 2020-05-29 ENCOUNTER — Encounter: Payer: Self-pay | Admitting: Hospice and Palliative Medicine

## 2020-07-28 ENCOUNTER — Other Ambulatory Visit: Payer: Self-pay | Admitting: Internal Medicine

## 2020-07-28 ENCOUNTER — Other Ambulatory Visit: Payer: Self-pay | Admitting: Adult Health

## 2020-07-28 DIAGNOSIS — E559 Vitamin D deficiency, unspecified: Secondary | ICD-10-CM

## 2020-07-28 DIAGNOSIS — R0602 Shortness of breath: Secondary | ICD-10-CM

## 2020-07-28 NOTE — Telephone Encounter (Signed)
Pt can take OTC vitamin D 2000 IU

## 2020-08-11 ENCOUNTER — Ambulatory Visit: Payer: Medicare HMO | Admitting: Internal Medicine

## 2020-08-11 ENCOUNTER — Other Ambulatory Visit: Payer: Self-pay

## 2020-08-11 ENCOUNTER — Encounter: Payer: Self-pay | Admitting: Internal Medicine

## 2020-08-11 VITALS — BP 158/90 | HR 98 | Temp 97.9°F | Resp 16 | Ht 60.0 in | Wt 120.0 lb

## 2020-08-11 DIAGNOSIS — R0602 Shortness of breath: Secondary | ICD-10-CM | POA: Diagnosis not present

## 2020-08-11 DIAGNOSIS — J449 Chronic obstructive pulmonary disease, unspecified: Secondary | ICD-10-CM | POA: Diagnosis not present

## 2020-08-11 DIAGNOSIS — F17219 Nicotine dependence, cigarettes, with unspecified nicotine-induced disorders: Secondary | ICD-10-CM

## 2020-08-11 NOTE — Patient Instructions (Signed)
Chronic Obstructive Pulmonary Disease  Chronic obstructive pulmonary disease (COPD) is a long-term (chronic) lung problem. When you have COPD, it is hard for air to get in and out of your lungs. Usually the condition gets worse over time, and your lungs will never return to normal. There are things you can do to keep yourself as healthy as possible. What are the causes?  Smoking. This is the most common cause.  Certain genes passed from parent to child (inherited). What increases the risk?  Being exposed to secondhand smoke from cigarettes, pipes, or cigars.  Being exposed to chemicals and other irritants, such as fumes and dust in the work environment.  Having chronic lung conditions or infections. What are the signs or symptoms?  Shortness of breath, especially during physical activity.  A long-term cough with a large amount of thick mucus. Sometimes, the cough may not have any mucus (dry cough).  Wheezing.  Breathing quickly.  Skin that looks gray or blue, especially in the fingers, toes, or lips.  Feeling tired (fatigue).  Weight loss.  Chest tightness.  Having infections often.  Episodes when breathing symptoms become much worse (exacerbations). At the later stages of this disease, you may have swelling in the ankles, feet, or legs. How is this treated?  Taking medicines.  Quitting smoking, if you smoke.  Rehabilitation. This includes steps to make your body work better. It may involve a team of specialists.  Doing exercises.  Making changes to your diet.  Using oxygen.  Lung surgery.  Lung transplant.  Comfort measures (palliative care). Follow these instructions at home: Medicines  Take over-the-counter and prescription medicines only as told by your doctor.  Talk to your doctor before taking any cough or allergy medicines. You may need to avoid medicines that cause your lungs to be dry. Lifestyle  If you smoke, stop smoking. Smoking makes the  problem worse.  Do not smoke or use any products that contain nicotine or tobacco. If you need help quitting, ask your doctor.  Avoid being around things that make your breathing worse. This may include smoke, chemicals, and fumes.  Stay active, but remember to rest as well.  Learn and use tips on how to manage stress and control your breathing.  Make sure you get enough sleep. Most adults need at least 7 hours of sleep every night.  Eat healthy foods. Eat smaller meals more often. Rest before meals. Controlled breathing Learn and use tips on how to control your breathing as told by your doctor. Try:  Breathing in (inhaling) through your nose for 1 second. Then, pucker your lips and breath out (exhale) through your lips for 2 seconds.  Putting one hand on your belly (abdomen). Breathe in slowly through your nose for 1 second. Your hand on your belly should move out. Pucker your lips and breathe out slowly through your lips. Your hand on your belly should move in as you breathe out.   Controlled coughing Learn and use controlled coughing to clear mucus from your lungs. Follow these steps: 1. Lean your head a little forward. 2. Breathe in deeply. 3. Try to hold your breath for 3 seconds. 4. Keep your mouth slightly open while coughing 2 times. 5. Spit any mucus out into a tissue. 6. Rest and do the steps again 1 or 2 times as needed. General instructions  Make sure you get all the shots (vaccines) that your doctor recommends. Ask your doctor about a flu shot and a pneumonia shot.    Use oxygen therapy and pulmonary rehabilitation if told by your doctor. If you need home oxygen therapy, ask your doctor if you should buy a tool to measure your oxygen level (oximeter).  Make a COPD action plan with your doctor. This helps you to know what to do if you feel worse than usual.  Manage any other conditions you have as told by your doctor.  Avoid going outside when it is very hot, cold, or  humid.  Avoid people who have a sickness you can catch (contagious).  Keep all follow-up visits. Contact a doctor if:  You cough up more mucus than usual.  There is a change in the color or thickness of the mucus.  It is harder to breathe than usual.  Your breathing is faster than usual.  You have trouble sleeping.  You need to use your medicines more often than usual.  You have trouble doing your normal activities such as getting dressed or walking around the house. Get help right away if:  You have shortness of breath while resting.  You have shortness of breath that stops you from: ? Being able to talk. ? Doing normal activities.  Your chest hurts for longer than 5 minutes.  Your skin color is more blue than usual.  Your pulse oximeter shows that you have low oxygen for longer than 5 minutes.  You have a fever.  You feel too tired to breathe normally. These symptoms may represent a serious problem that is an emergency. Do not wait to see if the symptoms will go away. Get medical help right away. Call your local emergency services (911 in the [place] Do not drive yourself to the hospital. Summary  Chronic obstructive pulmonary disease (COPD) is a long-term lung problem.  The way your lungs work will never return to normal. Usually the condition gets worse over time. There are things you can do to keep yourself as healthy as possible.  Take over-the-counter and prescription medicines only as told by your doctor.  If you smoke, stop. Smoking makes the problem worse. This information is not intended to replace advice given to you by your health care provider. Make sure you discuss any questions you have with your health care provider. Document Revised: 02/19/2020 Document Reviewed: 02/19/2020 Elsevier Patient Education  2021 Elsevier Inc.   

## 2020-08-11 NOTE — Progress Notes (Signed)
[facility] [place], [place] [address]  Pulmonary Sleep Medicine   Office Visit Note  Patient Name: Kelly Curry DOB: April 12, 1956 MRN [id number]  Date of Service: 08/11/2020  Complaints/HPI: COPD follow up she is a current smoker. She has not been able to quit smoking. She states still has some SOB with activity. She states that she is not likely to ever quit smoking. She has some cough noted in the mornings. She states that she has no fevers or chills. She has not been vaccinated and does not want to be vaccinated for covid  ROS  General: (-) fever, (-) chills, (-) night sweats, (-) weakness Skin: (-) rashes, (-) itching,. Eyes: (-) visual changes, (-) redness, (-) itching. Nose and Sinuses: (-) nasal stuffiness or itchiness, (-) postnasal drip, (-) nosebleeds, (-) sinus trouble. Mouth and Throat: (-) sore throat, (-) hoarseness. Neck: (-) swollen glands, (-) enlarged thyroid, (-) neck pain. Respiratory: + cough, (-) bloody sputum, + shortness of breath, + wheezing. Cardiovascular: - ankle swelling, (-) chest pain. Lymphatic: (-) lymph node enlargement. Neurologic: (-) numbness, (-) tingling. Psychiatric: (-) anxiety, (-) depression   Current Medication: Outpatient Encounter Medications as of 08/11/2020  Medication Sig  . acetaminophen (TYLENOL) 500 MG tablet Take 500 mg by mouth every 4 (four) hours as needed.  Marland Kitchen albuterol (PROVENTIL) (2.5 MG/3ML) 0.083% nebulizer solution Take 3 mLs (2.5 mg total) by nebulization every 4 (four) hours as needed for wheezing or shortness of breath.  Marland Kitchen atorvastatin (LIPITOR) 10 MG tablet Take 1 tablet (10 mg total) by mouth daily.  . fluticasone furoate-vilanterol (BREO ELLIPTA) 100-25 MCG/INH AEPB Inhale 1 puff into the lungs daily.  Marland Kitchen ibandronate (BONIVA) 150 MG tablet Take 1 tablet (150 mg total) by mouth every 30 (thirty) days. Take in the morning with a full glass of water, on an empty stomach, and do not take  anything else by mouth or lie down for the next 30 min.  Marland Kitchen omeprazole (PRILOSEC) 20 MG capsule Take 20 mg by mouth daily.  . [DISCONTINUED] docusate sodium (COLACE) 100 MG capsule Take 100 mg by mouth 2 (two) times daily. (Patient not taking: Reported on 08/11/2020)  . [DISCONTINUED] Vitamin D, Ergocalciferol, (DRISDOL) 1.25 MG (50000 UNIT) CAPS capsule Take 1 capsule (50,000 Units total) by mouth once a week. (Patient not taking: Reported on 08/11/2020)   No facility-administered encounter medications on file as of 08/11/2020.    Surgical History: Past Surgical History:  Procedure Laterality Date  . LUNG BIOPSY  2016  . TUBAL LIGATION      Medical History: Past Medical History:  Diagnosis Date  . 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 COPD (chronic obstructive pulmonary disease) ([place])   . Pneumonia   . Shortness of breath dyspnea     Family History: Family History  Problem Relation Age of Onset  . Breast cancer Mother   . COPD Father   . Emphysema Father     Social History: Social History   Socioeconomic History  . Marital status: Married    Spouse name: Not on file  . Number of children: Not on file  . Years of education: Not on file  . Highest education level: Not on file  Occupational History  . Not on file  Tobacco Use  . Smoking status: Current Every Day Smoker    Packs/day: 1.00    Years: 40.00    Pack years: 40.00    Types: Cigarettes  .  Smokeless tobacco: Never Used  Substance and Sexual Activity  . Alcohol use: Yes    Alcohol/week: 7.0 standard drinks    Types: 7 Cans of beer per week    Comment: socially  . Drug use: No  . Sexual activity: Not on file  Other Topics Concern  . Not on file  Social History Narrative  . Not on file   Social Determinants of Health   Financial Resource Strain: Not on file  Food Insecurity: Not on file  Transportation Needs: Not on file  Physical Activity: Not on file  Stress: Not on file  Social Connections: Not on  file  Intimate Partner Violence: Not on file    Vital Signs: Blood pressure (!) 158/90, pulse 98, temperature 97.9 F (36.6 C), resp. rate 16, height 5' (1.524 m), weight 120 lb (54.4 kg), SpO2 98 %.  Examination: General Appearance: The patient is well-developed, well-nourished, and in no distress. Skin: Gross inspection of skin unremarkable. Head: normocephalic, no gross deformities. Eyes: no gross deformities noted. ENT: ears appear grossly normal no exudates. Neck: Supple. No thyromegaly. No LAD. Respiratory: few rhochi noted at this time. Cardiovascular: Normal S1 and S2 without murmur or rub. Extremities: No cyanosis. pulses are equal. Neurologic: Alert and oriented. No involuntary movements.  LABS: No results found for this or any previous visit (from the past 2160 hour(s)).  Radiology: CT Chest Wo Contrast  Result Date: 05/21/2020 CLINICAL DATA:  Abnormal x-ray.  Follow-up lung nodule. EXAM: CT CHEST WITHOUT CONTRAST TECHNIQUE: Multidetector CT imaging of the chest was performed following the standard protocol without IV contrast. COMPARISON:  CT chest dated Sep 06, 2014. FINDINGS: Cardiovascular: Heart size is stable. There are atherosclerotic changes of the thoracic aorta without evidence for an aneurysm. There are coronary artery calcifications. Mediastinum/Nodes: -- No mediastinal lymphadenopathy. -- No hilar lymphadenopathy. -- No axillary lymphadenopathy. -- No supraclavicular lymphadenopathy. -- Normal thyroid gland where visualized. -  Unremarkable esophagus. Lungs/Pleura: Emphysematous changes are noted bilaterally. There is persistent scarring and volume loss involving the left upper lobe, improved from prior study. There is a new band like area of fibrosis involving the right upper lobe. There is a large calcified granuloma in the left lower lobe. The trachea is unremarkable. Upper Abdomen: The liver surface appears slightly nodular. Musculoskeletal: No chest wall  abnormality. No bony spinal canal stenosis. IMPRESSION: 1. There is persistent scarring and volume loss involving the left upper lobe, improved from prior study. 2. There is a new band like area of fibrosis involving the right upper lobe. 3. Coronary artery disease. 4. The liver surface appears slightly nodular. Correlation with laboratory studies is recommended to help exclude cirrhosis. 5. Emphysema. Aortic Atherosclerosis (ICD10-I70.0) and Emphysema (ICD10-J43.9). Electronically Signed   By: Constance Holster M.D.   On: 05/21/2020 20:40    No results found.  No results found.    Assessment and Plan: Patient Active Problem List   Diagnosis Date Noted  . COPD (chronic obstructive pulmonary disease) ([place]) 07/14/2017  . Pneumonia due to Pseudomonas ([place]) 08/25/2014  . Sepsis ([place]) 08/24/2014    1. Obstructive chronic bronchitis without exacerbation ([place]) Unfortunately she is still smoking and she does not want to stop. Counseling provided again  2. SOB (shortness of breath) Due to above - Spirometry with Graph  3. Obesity, morbid ([place]) Obesity Counseling: Had a lengthy discussion regarding patients BMI and weight issues. Patient was instructed on portion control as well as increased activity. Also discussed caloric restrictions with trying to  maintain intake less than 2000 Kcal. Discussions were made in accordance with the 5As of weight management. Simple actions such as not eating late and if able to, taking a walk is suggested.  4. Cigarette nicotine dependence with nicotine-induced disorder STOP SMOKING    General Counseling: I have discussed the findings of the evaluation and examination with [name].  I have also discussed any further diagnostic evaluation thatmay be needed or ordered today. Novie verbalizes understanding of the findings of todays visit. We also reviewed her medications today and discussed drug interactions and side effects including but not limited excessive  drowsiness and altered mental states. We also discussed that there is always a risk not just to her but also people around her. she has been encouraged to call the office with any questions or concerns that should arise related to todays visit.  Orders Placed This Encounter  Procedures  . Spirometry with Graph    Order Specific Question:   Where should this test be performed?    Answer:   [facility]    Order Specific Question:   Basic spirometry    Answer:   Yes    Order Specific Question:   Spirometry pre & post bronchodilator    Answer:   No     Time spent: 88  I have personally obtained a history, examined the patient, evaluated laboratory and imaging results, formulated the assessment and plan and placed orders.    Allyne Gee, MD [facility] Pulmonary and Critical Care Sleep medicine

## 2020-08-26 ENCOUNTER — Other Ambulatory Visit: Payer: Self-pay | Admitting: Adult Health

## 2020-08-26 DIAGNOSIS — R0602 Shortness of breath: Secondary | ICD-10-CM

## 2020-09-19 ENCOUNTER — Encounter: Payer: Self-pay | Admitting: Physician Assistant

## 2020-09-19 ENCOUNTER — Ambulatory Visit (INDEPENDENT_AMBULATORY_CARE_PROVIDER_SITE_OTHER): Payer: Medicare HMO | Admitting: Physician Assistant

## 2020-09-19 DIAGNOSIS — Z20822 Contact with and (suspected) exposure to covid-19: Secondary | ICD-10-CM

## 2020-09-19 DIAGNOSIS — R112 Nausea with vomiting, unspecified: Secondary | ICD-10-CM | POA: Diagnosis not present

## 2020-09-19 DIAGNOSIS — B349 Viral infection, unspecified: Secondary | ICD-10-CM

## 2020-09-19 MED ORDER — AZITHROMYCIN 250 MG PO TABS: ORAL_TABLET | ORAL | 0 refills | Status: DC

## 2020-09-19 MED ORDER — ONDANSETRON HCL 4 MG PO TABS: 4.0000 mg | ORAL_TABLET | Freq: Three times a day (TID) | ORAL | 0 refills | Status: DC | PRN

## 2020-09-19 MED ORDER — PREDNISONE 10 MG PO TABS: ORAL_TABLET | ORAL | 0 refills | Status: DC

## 2020-09-19 NOTE — Progress Notes (Signed)
[facility] [place], [place] [address]  Internal MEDICINE  Telephone Visit  Patient Name: Kelly Curry  [id number]  [id number]  Date of Service: 09/19/2020  I connected with the patient at 10:46 by telephone and verified the patients identity using two identifiers.   I discussed the limitations, risks, security and privacy concerns of performing an evaluation and management service by telephone and the availability of in person appointments. I also discussed with the patient that there may be a patient responsible charge related to the service.  The patient expressed understanding and agrees to proceed.    Chief Complaint  Patient presents with  . Cough  . Chills  . bodyaches  . Headache  . Vomiting    Started this morning    HPI Pt is here for virtual sick visit due to covid exposure -Symptoms started Saturday/Sunday. She and her husband both sick and got worse yesterday. Nausea and vomiting started this morning. Also has body aches, headaches, cough and chest a little tight with wheezing, SOB. Uses Breo daily and has used nebulizer once or twice as needed. Drinking lots of water and eating a bland diet like toast. -Has not tested for covid, but is isolating as if they have covid  Current Medication: Outpatient Encounter Medications as of 09/19/2020  Medication Sig  . acetaminophen (TYLENOL) 500 MG tablet Take 500 mg by mouth every 4 (four) hours as needed.  Marland Kitchen albuterol (PROVENTIL) (2.5 MG/3ML) 0.083% nebulizer solution Take 3 mLs (2.5 mg total) by nebulization every 4 (four) hours as needed for wheezing or shortness of breath.  . APPLE CIDER VINEGAR PO Take by mouth.  Marland Kitchen atorvastatin (LIPITOR) 10 MG tablet Take 1 tablet (10 mg total) by mouth daily.  Marland Kitchen azithromycin (ZITHROMAX) 250 MG tablet Take one tab a day for 10 days for uri  . BREO ELLIPTA 100-25 MCG/INH AEPB INHALE 1 PUFF BY MOUTH INTO THE LUNGS ONCE DAILY  . ibandronate (BONIVA) 150 MG tablet  Take 1 tablet (150 mg total) by mouth every 30 (thirty) days. Take in the morning with a full glass of water, on an empty stomach, and do not take anything else by mouth or lie down for the next 30 min.  Marland Kitchen omeprazole (PRILOSEC) 20 MG capsule Take 20 mg by mouth daily.  . ondansetron (ZOFRAN) 4 MG tablet Take 1 tablet (4 mg total) by mouth every 8 (eight) hours as needed for nausea or vomiting.  . predniSONE (DELTASONE) 10 MG tablet Take one tab 3 x day for 3 days, then take one tab 2 x a day for 3 days and then take one tab a day for 3 days for copd   No facility-administered encounter medications on file as of 09/19/2020.    Surgical History: Past Surgical History:  Procedure Laterality Date  . LUNG BIOPSY  2016  . TUBAL LIGATION      Medical History: Past Medical History:  Diagnosis Date  . 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 COPD (chronic obstructive pulmonary disease) ([place])   . Pneumonia   . Shortness of breath dyspnea     Family History: Family History  Problem Relation Age of Onset  . Breast cancer Mother   . COPD Father   . Emphysema Father     Social History   Socioeconomic History  . Marital status: Married    Spouse name: Not on file  . Number of children: Not on file  . Years  of education: Not on file  . Highest education level: Not on file  Occupational History  . Not on file  Tobacco Use  . Smoking status: Current Every Day Smoker    Packs/day: 1.00    Years: 40.00    Pack years: 40.00    Types: Cigarettes  . Smokeless tobacco: Never Used  Substance and Sexual Activity  . Alcohol use: Yes    Alcohol/week: 7.0 standard drinks    Types: 7 Cans of beer per week    Comment: socially  . Drug use: No  . Sexual activity: Not on file  Other Topics Concern  . Not on file  Social History Narrative  . Not on file   Social Determinants of Health   Financial Resource Strain: Not on file  Food Insecurity: Not on file  Transportation Needs: Not on file   Physical Activity: Not on file  Stress: Not on file  Social Connections: Not on file  Intimate Partner Violence: Not on file      Review of Systems  Constitutional: Positive for chills. Negative for fatigue and fever.  HENT: Positive for congestion. Negative for mouth sores and postnasal drip.   Respiratory: Positive for cough, shortness of breath and wheezing.   Cardiovascular: Negative for chest pain.  Gastrointestinal: Positive for nausea and vomiting.  Genitourinary: Negative for flank pain.  Musculoskeletal: Positive for myalgias.  Neurological: Positive for headaches.  Psychiatric/Behavioral: Negative.     Vital Signs: Resp 16   Ht 5' (1.524 m)   Wt 117 lb (53.1 kg)   BMI 22.85 kg/m    Observation/Objective:  Pt is able to carry out conversation  Assessment/Plan: 1. Close exposure to COVID-19 virus Will start on zpak and prednisone, should continue breathing treatments as needed. Should use tylenol for headaches and body aches. Pt educated tto stay well hydrated and go to ED if acute worsening. - azithromycin (ZITHROMAX) 250 MG tablet; Take one tab a day for 10 days for uri  Dispense: 10 tablet; Refill: 0 - predniSONE (DELTASONE) 10 MG tablet; Take one tab 3 x day for 3 days, then take one tab 2 x a day for 3 days and then take one tab a day for 3 days for copd  Dispense: 18 tablet; Refill: 0 - ondansetron (ZOFRAN) 4 MG tablet; Take 1 tablet (4 mg total) by mouth every 8 (eight) hours as needed for nausea or vomiting.  Dispense: 20 tablet; Refill: 0  2. Viral disease Will start on zpak and prednisone, should continue breathing treatments as needed. Should use tylenol for headaches and body aches. Pt educated tto stay well hydrated and go to ED if acute worsening. - azithromycin (ZITHROMAX) 250 MG tablet; Take one tab a day for 10 days for uri  Dispense: 10 tablet; Refill: 0 - predniSONE (DELTASONE) 10 MG tablet; Take one tab 3 x day for 3 days, then take one tab 2 x  a day for 3 days and then take one tab a day for 3 days for copd  Dispense: 18 tablet; Refill: 0 - ondansetron (ZOFRAN) 4 MG tablet; Take 1 tablet (4 mg total) by mouth every 8 (eight) hours as needed for nausea or vomiting.  Dispense: 20 tablet; Refill: 0  3. Non-intractable vomiting with nausea, unspecified vomiting type Educated to stay well hydrated and drink gatorade in addition to water and eat bland diet as tolerated. Pt may take zofran as needed for nausea and instructed to go to ED if worsening and unable  to stay hydrated. - ondansetron (ZOFRAN) 4 MG tablet; Take 1 tablet (4 mg total) by mouth every 8 (eight) hours as needed for nausea or vomiting.  Dispense: 20 tablet; Refill: 0   General Counseling: Sujey verbalizes understanding of the findings of today's phone visit and agrees with plan of treatment. I have discussed any further diagnostic evaluation that may be needed or ordered today. We also reviewed her medications today. she has been encouraged to call the office with any questions or concerns that should arise related to todays visit.    No orders of the defined types were placed in this encounter.   Meds ordered this encounter  Medications  . azithromycin (ZITHROMAX) 250 MG tablet    Sig: Take one tab a day for 10 days for uri    Dispense:  10 tablet    Refill:  0  . predniSONE (DELTASONE) 10 MG tablet    Sig: Take one tab 3 x day for 3 days, then take one tab 2 x a day for 3 days and then take one tab a day for 3 days for copd    Dispense:  18 tablet    Refill:  0  . ondansetron (ZOFRAN) 4 MG tablet    Sig: Take 1 tablet (4 mg total) by mouth every 8 (eight) hours as needed for nausea or vomiting.    Dispense:  20 tablet    Refill:  0    Time spent:30 Minutes    Dr Lavera Guise Internal medicine

## 2020-09-24 ENCOUNTER — Ambulatory Visit: Payer: Medicare HMO | Admitting: Internal Medicine

## 2020-10-02 ENCOUNTER — Ambulatory Visit: Payer: Medicare HMO | Admitting: Nurse Practitioner

## 2020-10-09 ENCOUNTER — Other Ambulatory Visit: Payer: Self-pay

## 2020-10-09 ENCOUNTER — Ambulatory Visit (INDEPENDENT_AMBULATORY_CARE_PROVIDER_SITE_OTHER): Payer: Medicare HMO | Admitting: Nurse Practitioner

## 2020-10-09 ENCOUNTER — Encounter: Payer: Self-pay | Admitting: Nurse Practitioner

## 2020-10-09 VITALS — BP 150/92 | HR 90 | Temp 97.5°F | Resp 16 | Ht 60.0 in | Wt 117.4 lb

## 2020-10-09 DIAGNOSIS — Z122 Encounter for screening for malignant neoplasm of respiratory organs: Secondary | ICD-10-CM

## 2020-10-09 DIAGNOSIS — Z0001 Encounter for general adult medical examination with abnormal findings: Secondary | ICD-10-CM

## 2020-10-09 DIAGNOSIS — Z1212 Encounter for screening for malignant neoplasm of rectum: Secondary | ICD-10-CM

## 2020-10-09 DIAGNOSIS — E782 Mixed hyperlipidemia: Secondary | ICD-10-CM

## 2020-10-09 DIAGNOSIS — Z1231 Encounter for screening mammogram for malignant neoplasm of breast: Secondary | ICD-10-CM | POA: Diagnosis not present

## 2020-10-09 DIAGNOSIS — K219 Gastro-esophageal reflux disease without esophagitis: Secondary | ICD-10-CM | POA: Diagnosis not present

## 2020-10-09 DIAGNOSIS — D229 Melanocytic nevi, unspecified: Secondary | ICD-10-CM

## 2020-10-09 DIAGNOSIS — N6310 Unspecified lump in the right breast, unspecified quadrant: Secondary | ICD-10-CM | POA: Diagnosis not present

## 2020-10-09 DIAGNOSIS — E559 Vitamin D deficiency, unspecified: Secondary | ICD-10-CM | POA: Diagnosis not present

## 2020-10-09 DIAGNOSIS — Z0189 Encounter for other specified special examinations: Secondary | ICD-10-CM

## 2020-10-09 DIAGNOSIS — R300 Dysuria: Secondary | ICD-10-CM | POA: Diagnosis not present

## 2020-10-09 DIAGNOSIS — Z1211 Encounter for screening for malignant neoplasm of colon: Secondary | ICD-10-CM

## 2020-10-09 DIAGNOSIS — F17210 Nicotine dependence, cigarettes, uncomplicated: Secondary | ICD-10-CM

## 2020-10-09 NOTE — Progress Notes (Signed)
[facility] [place], [place] [address]  Internal MEDICINE  Office Visit Note  Patient Name: Kelly Curry  [id number]  [id number]  Date of Service: 10/11/2020  Chief Complaint  Patient presents with   Medicare Wellness    Lingering cough after covid, SOB, constipation worse after covid, normally every 3rd day pt has a bowel movement but for two weeks after covid pt didn't go at all   COPD    HPI Kelly Curry presents for an annual well visit and physical exam.  She has a history of COPD and a cancerous mass in the left lung.  She has a history of pneumonia and dyspnea.  She is a current heavy smoker and she reports that she will not quit and denies smoking cessation counseling.  She is due for her mammogram she has had a history of a mass found in the right breast so she gets breast ultrasound on the right as well.  She is due for routine colorectal cancer screening and states she does not want a colonoscopy and is requesting the Cologuard stool test.  She is due for several routine vaccinations but has declined all of them.  Her blood pressure is elevated in the office today at 150/92 she reports that it is only ever elevated when she was in the clinic and at home her blood pressure is normal. She is getting over a recent COVID infection.  She she reports a lingering cough and some shortness of breath although she has continued to smoke cigarettes throughout this time.  She reports being constipated for 2 weeks while she was sick with COVID and she normally has a bowel movement every 3 days but she went without a bowel movement for 2 weeks.  She reports that the is getting back to her normal bowel routine. Routine labs will be ordered and sent to [facility] for the patient to have done. She is also requesting a dermatology referral because she has several moles she would like removed.   Current Medication: Outpatient Encounter Medications as of 10/09/2020  Medication Sig    acetaminophen (TYLENOL) 500 MG tablet Take 500 mg by mouth every 4 (four) hours as needed.   albuterol (PROVENTIL) (2.5 MG/3ML) 0.083% nebulizer solution Take 3 mLs (2.5 mg total) by nebulization every 4 (four) hours as needed for wheezing or shortness of breath.   APPLE CIDER VINEGAR PO Take by mouth.   atorvastatin (LIPITOR) 10 MG tablet Take 1 tablet (10 mg total) by mouth daily.   BREO ELLIPTA 100-25 MCG/INH AEPB INHALE 1 PUFF BY MOUTH INTO THE LUNGS ONCE DAILY   omeprazole (PRILOSEC) 20 MG capsule Take 20 mg by mouth daily.   ibandronate (BONIVA) 150 MG tablet Take 1 tablet (150 mg total) by mouth every 30 (thirty) days. Take in the morning with a full glass of water, on an empty stomach, and do not take anything else by mouth or lie down for the next 30 min.   [DISCONTINUED] azithromycin (ZITHROMAX) 250 MG tablet Take one tab a day for 10 days for uri (Patient not taking: Reported on 10/09/2020)   [DISCONTINUED] ibandronate (BONIVA) 150 MG tablet Take 1 tablet (150 mg total) by mouth every 30 (thirty) days. Take in the morning with a full glass of water, on an empty stomach, and do not take anything else by mouth or lie down for the next 30 min.   [DISCONTINUED] ondansetron (ZOFRAN) 4 MG tablet Take 1 tablet (4 mg total) by  mouth every 8 (eight) hours as needed for nausea or vomiting. (Patient not taking: Reported on 10/09/2020)   [DISCONTINUED] predniSONE (DELTASONE) 10 MG tablet Take one tab 3 x day for 3 days, then take one tab 2 x a day for 3 days and then take one tab a day for 3 days for copd (Patient not taking: Reported on 10/09/2020)   No facility-administered encounter medications on file as of 10/09/2020.    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   COPD (chronic obstructive pulmonary disease) ([facility])    Pneumonia     Shortness of breath dyspnea     Family History: Family History  Problem Relation Age of Onset   Breast cancer Mother    COPD Father    Emphysema Father     Social History   Socioeconomic History   Marital status: Married    Spouse name: Not on file   Number of children: Not on file   Years of education: Not on file   Highest education level: Not on file  Occupational History   Not on file  Tobacco Use   Smoking status: Every Day    Packs/day: 1.00    Years: 40.00    Pack years: 40.00    Types: Cigarettes   Smokeless tobacco: Never  Substance and Sexual Activity   Alcohol use: Yes    Alcohol/week: 7.0 standard drinks    Types: 7 Cans of beer per week    Comment: socially   Drug use: No   Sexual activity: Not on file  Other Topics Concern   Not on file  Social History Narrative   Not on file   Social Determinants of Health   Financial Resource Strain: Not on file  Food Insecurity: Not on file  Transportation Needs: Not on file  Physical Activity: Not on file  Stress: Not on file  Social Connections: Not on file  Intimate Partner Violence: Not on file      Review of Systems  Constitutional:  Positive for fatigue. Negative for activity change, appetite change, chills, fever and unexpected weight change.  HENT:  Positive for congestion and hearing loss. Negative for ear pain, rhinorrhea, sore throat and trouble swallowing.   Eyes: Negative.  Negative for pain, discharge and itching.  Respiratory:  Positive for cough and wheezing. Negative for chest tightness and shortness of breath.   Cardiovascular:  Positive for chest pain (related to cough).  Gastrointestinal:  Positive for abdominal pain (gas pains) and constipation. Negative for blood in stool, diarrhea, nausea and vomiting.  Endocrine: Negative.   Genitourinary: Negative.  Negative for difficulty urinating, dysuria, frequency, hematuria and urgency.  Musculoskeletal: Negative.  Negative for arthralgias,  back pain, joint swelling, myalgias and neck pain.  Skin: Negative.  Negative for rash and wound.  Allergic/Immunologic: Negative.  Negative for immunocompromised state.  Neurological: Negative.  Negative for dizziness, seizures, numbness and headaches.  Hematological: Negative.   Psychiatric/Behavioral:  Positive for sleep disturbance. Negative for behavioral problems, confusion, self-injury and suicidal ideas. The patient is not nervous/anxious.    Vital Signs: BP (!) 150/92   Pulse 90   Temp (!) 97.5 F (36.4 C)   Resp 16   Ht 5' (1.524 m)   Wt 117 lb 6.4 oz (53.3 kg)  SpO2 98%   BMI 22.93 kg/m    Physical Exam Vitals reviewed.  Constitutional:      General: She is not in acute distress.    Appearance: Normal appearance. She is well-developed and normal weight. She is not ill-appearing or diaphoretic.  HENT:     Head: Normocephalic and atraumatic.     Right Ear: Tympanic membrane, ear canal and external ear normal.     Left Ear: Tympanic membrane, ear canal and external ear normal.     Nose: Nose normal. No congestion.     Mouth/Throat:     Mouth: Mucous membranes are moist.     Pharynx: Oropharynx is clear. No oropharyngeal exudate.  Eyes:     Pupils: Pupils are equal, round, and reactive to light.  Neck:     Thyroid: No thyromegaly.     Vascular: No JVD.     Trachea: No tracheal deviation.  Cardiovascular:     Rate and Rhythm: Normal rate and regular rhythm.     Pulses: Normal pulses.     Heart sounds: Normal heart sounds. No murmur heard.   No friction rub. No gallop.  Pulmonary:     Effort: Pulmonary effort is normal. No respiratory distress.     Breath sounds: Wheezing present. No rales.  Chest:     Chest wall: No tenderness.  Abdominal:     General: Bowel sounds are normal.     Palpations: Abdomen is soft. There is no mass.     Tenderness: There is no abdominal tenderness. There is no guarding or rebound.     Hernia: No hernia is present.   Musculoskeletal:        General: Normal range of motion.     Cervical back: Normal range of motion and neck supple.  Lymphadenopathy:     Cervical: No cervical adenopathy.  Skin:    General: Skin is warm and dry.     Capillary Refill: Capillary refill takes less than 2 seconds.     Comments: Several benign looking moles noted scattered in different area including neck, back and chest. No discoloration, irregular borders or asymmetry noted.   Neurological:     Mental Status: She is alert and oriented to person, place, and time.  Psychiatric:        Mood and Affect: Mood normal.        Behavior: Behavior normal.        Thought Content: Thought content normal.        Judgment: Judgment normal.   Assessment/Plan: 1. Encounter for general adult medical examination with abnormal findings Age-appropriate preventive screenings discussed, annual physical exam completed. Declines all age-appropriate vaccinations at this time. Age-appropriate screenings were addressed with patient and ordered.    2. Breast mass, right History of breast mass found, she gets annual ultrasound of right breast along with her screening mammogram. - US BREAST LTD UNI RIGHT INC AXILLA; Future  3. Gastroesophageal reflux disease without esophagitis History of GERD, taking omeprazole, symptoms well controlled.   4. Mixed hyperlipidemia History of hyperlipidemia, recheck lipid panel.  - Lipid Panel With LDL/HDL Ratio  5. Very heavy cigarette smoker Attempted smoking cessation counseling. Patient declined and states she is not interested in quitting despite the risks. Patient has COPD, emphysema, history of pulmonary nodule found on imaging and she acknowledges this information and the risks associated with continuing smoking.   6. Vitamin D deficiency History of vitamin D deficiency, recheck vitamin D level.  - Vitamin D (25  hydroxy)  7. Encounter for routine laboratory testing Routine labs ordered.  - CBC  with Differential/Platelet - TSH + free T4 - CMP14+EGFR  8. Encounter for screening for lung cancer Due for lung cancer screening, current heavy smoker, prior nodule found on previous CT as well as a large calcified granuloma.  - CT CHEST LUNG CA SCREEN LOW DOSE W/O CM; Future  9. Encounter for screening mammogram for breast cancer Due for screening mammogram. Order placed.  - MM Digital Screening; Future  10. Screening for colorectal cancer Declined screening colonoscopy. Requesting cologard stool test.   11. Benign mole Patient has several benign moles scattered in different areas. She would like them removed. Dermatology referral ordered.  - Ambulatory referral to Dermatology  12. Dysuria Routine urinalysis done.  - UA/M w/rflx Culture, Routine      General Counseling: Cassey verbalizes understanding of the findings of todays visit and agrees with plan of treatment. I have discussed any further diagnostic evaluation that may be needed or ordered today. We also reviewed her medications today. she has been encouraged to call the office with any questions or concerns that should arise related to todays visit.    Orders Placed This Encounter  Procedures   Microscopic Examination   MM Digital Screening   US BREAST LTD UNI RIGHT INC AXILLA   UA/M w/rflx Culture, Routine    Meds ordered this encounter  Medications   ibandronate (BONIVA) 150 MG tablet    Sig: Take 1 tablet (150 mg total) by mouth every 30 (thirty) days. Take in the morning with a full glass of water, on an empty stomach, and do not take anything else by mouth or lie down for the next 30 min.    Dispense:  1 tablet    Refill:  11    Return in about 4 weeks (around 11/06/2020) for F/U cough, sob, Tanishi Nault PCP.   Total time spent:30 Minutes Time spent includes review of chart, medications, test results, and follow up plan with the patient.   [place] Controlled Substance Database was reviewed by me.  This  patient was seen by Jonetta Osgood, FNP-C in collaboration with Dr. Clayborn Bigness as a part of collaborative care agreement.  Niurka Benecke R. Valetta Fuller, MSN, FNP-C Internal medicine

## 2020-10-10 LAB — UA/M W/RFLX CULTURE, ROUTINE
Bilirubin, UA: NEGATIVE
Glucose, UA: NEGATIVE
Ketones, UA: NEGATIVE
Leukocytes,UA: NEGATIVE
Nitrite, UA: NEGATIVE
RBC, UA: NEGATIVE
Specific Gravity, UA: 1.019 (ref 1.005–1.030)
Urobilinogen, Ur: 0.2 mg/dL (ref 0.2–1.0)
pH, UA: 5.5 (ref 5.0–7.5)

## 2020-10-10 LAB — MICROSCOPIC EXAMINATION
Bacteria, UA: NONE SEEN
Casts: NONE SEEN /lpf
WBC, UA: NONE SEEN /hpf (ref 0–5)

## 2020-10-11 MED ORDER — IBANDRONATE SODIUM 150 MG PO TABS: 150.0000 mg | ORAL_TABLET | ORAL | 11 refills | Status: DC

## 2020-10-20 ENCOUNTER — Telehealth: Payer: Self-pay

## 2020-10-20 NOTE — Telephone Encounter (Signed)
Faxed cologuard requisition 10/20/2020.[facility]

## 2020-10-23 ENCOUNTER — Telehealth: Payer: Self-pay

## 2020-10-23 NOTE — Telephone Encounter (Signed)
Cologuard refax on 10/23/2020 [facility]

## 2020-10-28 ENCOUNTER — Telehealth: Payer: Self-pay

## 2020-10-28 ENCOUNTER — Other Ambulatory Visit: Payer: Self-pay | Admitting: Nurse Practitioner

## 2020-10-28 DIAGNOSIS — J439 Emphysema, unspecified: Secondary | ICD-10-CM

## 2020-10-28 MED ORDER — BREZTRI AEROSPHERE 160-9-4.8 MCG/ACT IN AERO: 2.0000 | INHALATION_SPRAY | Freq: Two times a day (BID) | RESPIRATORY_TRACT | 11 refills | Status: DC

## 2020-10-29 NOTE — Telephone Encounter (Signed)
Pt advised we send pres to [facility]

## 2020-11-05 ENCOUNTER — Ambulatory Visit: Payer: Medicare HMO | Admitting: Internal Medicine

## 2020-11-17 ENCOUNTER — Encounter: Payer: Self-pay | Admitting: Nurse Practitioner

## 2020-11-17 ENCOUNTER — Other Ambulatory Visit: Payer: Self-pay

## 2020-11-17 ENCOUNTER — Ambulatory Visit (INDEPENDENT_AMBULATORY_CARE_PROVIDER_SITE_OTHER): Payer: Medicare HMO | Admitting: Nurse Practitioner

## 2020-11-17 VITALS — BP 140/88 | HR 78 | Temp 98.6°F | Resp 16 | Ht 60.0 in | Wt 116.8 lb

## 2020-11-17 DIAGNOSIS — E782 Mixed hyperlipidemia: Secondary | ICD-10-CM

## 2020-11-17 DIAGNOSIS — H6123 Impacted cerumen, bilateral: Secondary | ICD-10-CM

## 2020-11-17 DIAGNOSIS — J439 Emphysema, unspecified: Secondary | ICD-10-CM

## 2020-11-17 DIAGNOSIS — I700 Atherosclerosis of aorta: Secondary | ICD-10-CM | POA: Diagnosis not present

## 2020-11-17 DIAGNOSIS — F17210 Nicotine dependence, cigarettes, uncomplicated: Secondary | ICD-10-CM

## 2020-11-17 DIAGNOSIS — Z1231 Encounter for screening mammogram for malignant neoplasm of breast: Secondary | ICD-10-CM | POA: Diagnosis not present

## 2020-11-17 DIAGNOSIS — H6121 Impacted cerumen, right ear: Secondary | ICD-10-CM

## 2020-11-17 NOTE — Progress Notes (Signed)
[facility] [place],  [address]  Internal MEDICINE  Office Visit Note  Patient Name: Kelly Curry  [id number]  [id number]  Date of Service: 11/17/2020  Chief Complaint  Patient presents with   Follow-up    Couch, sob    HPI Kelly Curry presents for a follow up visit for COPD. She has been using Breztri since her last office visit. She says it has been helping and she feels like she is breathing better. She does not need any refills at this time.  -She has labs that were ordered at her last office visit for her annual that she has not had drawn. She was reminded to have her labs drawn and she confirmed she will have them drawn this week.  -she reports that her ear is stopped up on the right side and requests an ear lavage.      Current Medication: Outpatient Encounter Medications as of 11/17/2020  Medication Sig   acetaminophen (TYLENOL) 500 MG tablet Take 500 mg by mouth every 4 (four) hours as needed.   albuterol (PROVENTIL) (2.5 MG/3ML) 0.083% nebulizer solution Take 3 mLs (2.5 mg total) by nebulization every 4 (four) hours as needed for wheezing or shortness of breath.   APPLE CIDER VINEGAR PO Take by mouth.   atorvastatin (LIPITOR) 10 MG tablet Take 1 tablet (10 mg total) by mouth daily.   Budeson-Glycopyrrol-Formoterol (BREZTRI AEROSPHERE) 160-9-4.8 MCG/ACT AERO Inhale 2 puffs into the lungs 2 (two) times daily.   calcium-vitamin D (OSCAL WITH D) 500-200 MG-UNIT tablet Take 1 tablet by mouth.   ibandronate (BONIVA) 150 MG tablet Take 1 tablet (150 mg total) by mouth every 30 (thirty) days. Take in the morning with a full glass of water, on an empty stomach, and do not take anything else by mouth or lie down for the next 30 min.   omeprazole (PRILOSEC) 20 MG capsule Take 20 mg by mouth daily.   No facility-administered encounter medications on file as of 11/17/2020.    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   COPD (chronic obstructive pulmonary disease) (HCC)    Pneumonia    Shortness of breath dyspnea     Family History: Family History  Problem Relation Age of Onset   Breast cancer Mother    COPD Father    Emphysema Father     Social History   Socioeconomic History   Marital status: Married    Spouse name: Not on file   Number of children: Not on file   Years of education: Not on file   Highest education level: Not on file  Occupational History   Not on file  Tobacco Use   Smoking status: Every Day    Packs/day: 1.00    Years: 40.00    Pack years: 40.00    Types: Cigarettes   Smokeless tobacco: Never  Substance and Sexual Activity   Alcohol use: Yes    Alcohol/week: 7.0 standard drinks    Types: 7 Cans of beer per week    Comment: socially   Drug use: No   Sexual activity: Not on file  Other Topics Concern   Not on file  Social History Narrative   Not on file   Social Determinants of Health   Financial Resource  Strain: Not on file  Food Insecurity: Not on file  Transportation Needs: Not on file  Physical Activity: Not on file  Stress: Not on file  Social Connections: Not on file  Intimate Partner Violence: Not on file      Review of Systems  Constitutional:  Negative for chills, fatigue and unexpected weight change.  HENT:  Negative for congestion, rhinorrhea, sneezing and sore throat.   Eyes:  Negative for redness.  Respiratory:  Negative for cough, chest tightness and shortness of breath.   Cardiovascular:  Negative for chest pain and palpitations.  Gastrointestinal:  Negative for abdominal pain, constipation, diarrhea, nausea and vomiting.  Genitourinary:  Negative for dysuria and frequency.  Musculoskeletal:  Negative for arthralgias, back pain, joint swelling and neck pain.  Skin:  Negative for rash.   Neurological: Negative.  Negative for tremors and numbness.  Hematological:  Negative for adenopathy. Does not bruise/bleed easily.  Psychiatric/Behavioral:  Negative for behavioral problems (Depression), sleep disturbance and suicidal ideas. The patient is not nervous/anxious.    Vital Signs: BP 140/88   Pulse 78   Temp 98.6 F (37 C)   Resp 16   Ht 5' (1.524 m)   Wt 116 lb 12.8 oz (53 kg)   SpO2 98%   BMI 22.81 kg/m    Physical Exam Vitals reviewed.  Constitutional:      General: She is not in acute distress.    Appearance: Normal appearance. She is well-developed and normal weight. She is not ill-appearing or diaphoretic.  HENT:     Head: Normocephalic and atraumatic.  Neck:     Thyroid: No thyromegaly.     Vascular: No JVD.     Trachea: No tracheal deviation.  Cardiovascular:     Rate and Rhythm: Normal rate and regular rhythm.     Pulses: Normal pulses.     Heart sounds: Normal heart sounds. No murmur heard.   No friction rub. No gallop.  Pulmonary:     Effort: Pulmonary effort is normal. No respiratory distress.     Breath sounds: Normal breath sounds. No wheezing or rales.  Chest:     Chest wall: No tenderness.  Skin:    General: Skin is warm and dry.     Capillary Refill: Capillary refill takes less than 2 seconds.  Neurological:     Mental Status: She is alert and oriented to person, place, and time.     Cranial Nerves: No cranial nerve deficit.  Psychiatric:        Mood and Affect: Mood normal.        Behavior: Behavior normal.     Assessment/Plan: 1. Pulmonary emphysema, unspecified emphysema type ([place]) Continue Breztri as prescribed, no refills needed at this time.   2. Aortic atherosclerosis ([place]) Most recent carotid ultrasound done in January 2022 showed less than 50% stenosis bilaterally.  3. Very heavy cigarette smoker Patient smokes 1ppd x40, reports she is still not ready to quit smoking. Declined counseling at this time. Will attempt at  next office visit.   4. Mixed hyperlipidemia Patient had labs ordered in June, reminded her to have her labs drawn today  5. Impacted cerumen of right ear Ear lavage done in office today.  - Ear Lavage  6. Encounter for screening mammogram for malignant neoplasm of breast Routine screening mammogram ordered.  - MM Digital Screening; Future   General Counseling: solimar maiden understanding of the findings of todays visit and agrees with plan of treatment. I have discussed  any further diagnostic evaluation that may be needed or ordered today. We also reviewed her medications today. she has been encouraged to call the office with any questions or concerns that should arise related to todays visit.    Orders Placed This Encounter  Procedures   MM Digital Screening   Ear Lavage     No orders of the defined types were placed in this encounter.   Return in about 6 months (around 05/20/2021) for F/U, med refill, Khrystyne Arpin PCP.   Total time spent:30 Minutes Time spent includes review of chart, medications, test results, and follow up plan with the patient.   [place] Controlled Substance Database was reviewed by me.  This patient was seen by Jonetta Osgood, FNP-C in collaboration with Dr. Clayborn Bigness as a part of collaborative care agreement.   Pegah Segel R. Valetta Fuller, MSN, FNP-C Internal medicine

## 2020-11-19 ENCOUNTER — Ambulatory Visit: Payer: Medicare HMO | Admitting: Internal Medicine

## 2020-11-19 ENCOUNTER — Other Ambulatory Visit: Payer: Self-pay

## 2020-11-19 DIAGNOSIS — R0602 Shortness of breath: Secondary | ICD-10-CM

## 2020-11-19 DIAGNOSIS — J449 Chronic obstructive pulmonary disease, unspecified: Secondary | ICD-10-CM

## 2020-11-19 LAB — PULMONARY FUNCTION TEST

## 2020-11-21 NOTE — Procedures (Signed)
[facility] [address] [place], [address]    Complete Pulmonary Function Testing Interpretation:  FINDINGS:  Forced vital capacity is mildly decreased.  FEV1 is 0.85 L which is 43% of predicted and is severely decreased.  FEV1 FVC ratio is decreased.  Postbronchodilator there is no significant change in the FEV1 however clinical improvement may occur in the absence of spirometric improvement.  Total lung capacity is normal residual volume is normal residual volume total lung capacity ratio is increased.  FRC is normal.  DLCO was moderately decreased.  IMPRESSION:  This pulmonary function study is consistent with severe obstructive lung disease  Allyne Gee, MD [facility] Pulmonary Critical Care Medicine Sleep Medicine

## 2020-12-05 ENCOUNTER — Telehealth: Payer: Self-pay

## 2020-12-05 NOTE — Telephone Encounter (Signed)
Left vm for patient to return my call to see if she has had breast u/s completed-Toni

## 2020-12-09 ENCOUNTER — Telehealth: Payer: Self-pay

## 2020-12-09 NOTE — Telephone Encounter (Signed)
Left vm for patient to return my call to see if she has had breast u/s completed-Toni

## 2020-12-11 ENCOUNTER — Other Ambulatory Visit: Payer: Self-pay | Admitting: Nurse Practitioner

## 2020-12-11 ENCOUNTER — Other Ambulatory Visit: Payer: Self-pay | Admitting: Internal Medicine

## 2020-12-11 DIAGNOSIS — N6310 Unspecified lump in the right breast, unspecified quadrant: Secondary | ICD-10-CM

## 2020-12-11 NOTE — Telephone Encounter (Signed)
Spoke with patient. She will call [facility] today to schedule ultrasound and mammogram. She will call me back to let me know they have been scheduled.

## 2020-12-11 NOTE — Telephone Encounter (Signed)
I spoke with patient. Gave her appointment date of 12/17/20 @ 11:00 for mammogram & ultrasound.

## 2020-12-17 ENCOUNTER — Ambulatory Visit
Admission: RE | Admit: 2020-12-17 | Discharge: 2020-12-17 | Disposition: A | Discharge status: Home / Self Care | Payer: Medicare HMO | Source: Ambulatory Visit | Attending: Nurse Practitioner | Admitting: Nurse Practitioner

## 2020-12-17 ENCOUNTER — Other Ambulatory Visit: Payer: Self-pay

## 2020-12-17 DIAGNOSIS — Z0001 Encounter for general adult medical examination with abnormal findings: Secondary | ICD-10-CM

## 2020-12-17 DIAGNOSIS — Z1231 Encounter for screening mammogram for malignant neoplasm of breast: Secondary | ICD-10-CM | POA: Insufficient documentation

## 2020-12-17 DIAGNOSIS — K219 Gastro-esophageal reflux disease without esophagitis: Secondary | ICD-10-CM | POA: Insufficient documentation

## 2020-12-17 DIAGNOSIS — Z1211 Encounter for screening for malignant neoplasm of colon: Secondary | ICD-10-CM | POA: Insufficient documentation

## 2020-12-17 DIAGNOSIS — N6312 Unspecified lump in the right breast, upper inner quadrant: Secondary | ICD-10-CM | POA: Diagnosis not present

## 2020-12-17 DIAGNOSIS — Z0189 Encounter for other specified special examinations: Secondary | ICD-10-CM | POA: Insufficient documentation

## 2020-12-17 DIAGNOSIS — E559 Vitamin D deficiency, unspecified: Secondary | ICD-10-CM

## 2020-12-17 DIAGNOSIS — F17210 Nicotine dependence, cigarettes, uncomplicated: Secondary | ICD-10-CM | POA: Insufficient documentation

## 2020-12-17 DIAGNOSIS — Z1212 Encounter for screening for malignant neoplasm of rectum: Secondary | ICD-10-CM | POA: Insufficient documentation

## 2020-12-17 DIAGNOSIS — Z122 Encounter for screening for malignant neoplasm of respiratory organs: Secondary | ICD-10-CM | POA: Diagnosis not present

## 2020-12-17 DIAGNOSIS — E782 Mixed hyperlipidemia: Secondary | ICD-10-CM | POA: Diagnosis not present

## 2020-12-17 DIAGNOSIS — R300 Dysuria: Secondary | ICD-10-CM | POA: Diagnosis not present

## 2020-12-17 DIAGNOSIS — D229 Melanocytic nevi, unspecified: Secondary | ICD-10-CM

## 2020-12-17 DIAGNOSIS — N6310 Unspecified lump in the right breast, unspecified quadrant: Secondary | ICD-10-CM

## 2020-12-17 DIAGNOSIS — R922 Inconclusive mammogram: Secondary | ICD-10-CM | POA: Diagnosis not present

## 2020-12-23 DIAGNOSIS — E559 Vitamin D deficiency, unspecified: Secondary | ICD-10-CM | POA: Diagnosis not present

## 2020-12-23 DIAGNOSIS — Z0189 Encounter for other specified special examinations: Secondary | ICD-10-CM | POA: Diagnosis not present

## 2020-12-23 DIAGNOSIS — E782 Mixed hyperlipidemia: Secondary | ICD-10-CM | POA: Diagnosis not present

## 2020-12-24 LAB — CMP14+EGFR
ALT: 23 IU/L (ref 0–32)
AST: 24 IU/L (ref 0–40)
Albumin/Globulin Ratio: 1.9 (ref 1.2–2.2)
Albumin: 4.4 g/dL (ref 3.8–4.8)
Alkaline Phosphatase: 85 IU/L (ref 44–121)
BUN/Creatinine Ratio: 14 (ref 12–28)
BUN: 12 mg/dL (ref 8–27)
Bilirubin Total: 0.4 mg/dL (ref 0.0–1.2)
CO2: 23 mmol/L (ref 20–29)
Calcium: 9.6 mg/dL (ref 8.7–10.3)
Chloride: 94 mmol/L — ABNORMAL LOW (ref 96–106)
Creatinine, Ser: 0.83 mg/dL (ref 0.57–1.00)
Globulin, Total: 2.3 g/dL (ref 1.5–4.5)
Glucose: 87 mg/dL (ref 65–99)
Potassium: 4.8 mmol/L (ref 3.5–5.2)
Sodium: 133 mmol/L — ABNORMAL LOW (ref 134–144)
Total Protein: 6.7 g/dL (ref 6.0–8.5)
eGFR: 78 mL/min/{1.73_m2} (ref >59)

## 2020-12-24 LAB — CBC WITH DIFFERENTIAL/PLATELET
Basophils Absolute: 0.2 10*3/uL (ref 0.0–0.2)
Basos: 2 %
EOS (ABSOLUTE): 0.3 10*3/uL (ref 0.0–0.4)
Eos: 3 %
Hematocrit: 35.9 % (ref 34.0–46.6)
Hemoglobin: 11.8 g/dL (ref 11.1–15.9)
Immature Grans (Abs): 0.1 10*3/uL (ref 0.0–0.1)
Immature Granulocytes: 1 %
Lymphocytes Absolute: 3 10*3/uL (ref 0.7–3.1)
Lymphs: 28 %
MCH: 29 pg (ref 26.6–33.0)
MCHC: 32.9 g/dL (ref 31.5–35.7)
MCV: 88 fL (ref 79–97)
Monocytes Absolute: 1.1 10*3/uL — ABNORMAL HIGH (ref 0.1–0.9)
Monocytes: 10 %
Neutrophils Absolute: 6 10*3/uL (ref 1.4–7.0)
Neutrophils: 56 %
Platelets: 353 10*3/uL (ref 150–450)
RBC: 4.07 x10E6/uL (ref 3.77–5.28)
RDW: 13 % (ref 11.7–15.4)
WBC: 10.6 10*3/uL (ref 3.4–10.8)

## 2020-12-24 LAB — VITAMIN D 25 HYDROXY (VIT D DEFICIENCY, FRACTURES): Vit D, 25-Hydroxy: 71.1 ng/mL (ref 30.0–100.0)

## 2020-12-24 LAB — LIPID PANEL WITH LDL/HDL RATIO
Cholesterol, Total: 179 mg/dL (ref 100–199)
HDL: 51 mg/dL (ref >39)
LDL Chol Calc (NIH): 108 mg/dL — ABNORMAL HIGH (ref 0–99)
LDL/HDL Ratio: 2.1 ratio (ref 0.0–3.2)
Triglycerides: 111 mg/dL (ref 0–149)
VLDL Cholesterol Cal: 20 mg/dL (ref 5–40)

## 2020-12-24 LAB — TSH+FREE T4
Free T4: 1.37 ng/dL (ref 0.82–1.77)
TSH: 2.8 u[IU]/mL (ref 0.450–4.500)

## 2021-02-09 ENCOUNTER — Ambulatory Visit: Payer: Medicare HMO | Admitting: Nurse Practitioner

## 2021-02-09 ENCOUNTER — Other Ambulatory Visit: Payer: Self-pay

## 2021-02-09 ENCOUNTER — Encounter: Payer: Self-pay | Admitting: Physician Assistant

## 2021-02-09 ENCOUNTER — Ambulatory Visit: Payer: Medicare HMO | Admitting: Internal Medicine

## 2021-02-09 VITALS — BP 136/88 | HR 97 | Temp 98.2°F | Resp 16 | Ht 60.0 in | Wt 120.2 lb

## 2021-02-09 DIAGNOSIS — F17210 Nicotine dependence, cigarettes, uncomplicated: Secondary | ICD-10-CM | POA: Diagnosis not present

## 2021-02-09 DIAGNOSIS — I700 Atherosclerosis of aorta: Secondary | ICD-10-CM

## 2021-02-09 DIAGNOSIS — J438 Other emphysema: Secondary | ICD-10-CM | POA: Diagnosis not present

## 2021-02-09 MED ORDER — ATORVASTATIN CALCIUM 10 MG PO TABS: 10.0000 mg | ORAL_TABLET | Freq: Every day | ORAL | 3 refills | Status: DC

## 2021-02-09 NOTE — Progress Notes (Signed)
[facility], [place] [address]  Internal MEDICINE  Office Visit Note  Patient Name: Kelly Curry  [id number]  [id number]  Date of Service: 02/09/2021  Chief Complaint  Patient presents with   Results    PFT results, labs   Follow-up    6 months    HPI Kelly Curry presents for a follow up visit to discuss PFT and lab results. Her pulmonary function test shows severe obstructive lung disease with slight improvement when compared to her previous PFT. She is currently using Breztri for maintenance inhaler.  -Her lipid panel is significantly improved and her metabolic panel showed slight hyponatremia.  --declined screening colonoscopy and cologard stool test. Declined all recommended vaccines.     Current Medication: Outpatient Encounter Medications as of 02/09/2021  Medication Sig   acetaminophen (TYLENOL) 500 MG tablet Take 500 mg by mouth every 4 (four) hours as needed.   albuterol (PROVENTIL) (2.5 MG/3ML) 0.083% nebulizer solution Take 3 mLs (2.5 mg total) by nebulization every 4 (four) hours as needed for wheezing or shortness of breath.   APPLE CIDER VINEGAR PO Take by mouth.   aspirin EC 81 MG tablet Take 81 mg by mouth daily. Swallow whole.   Budeson-Glycopyrrol-Formoterol (BREZTRI AEROSPHERE) 160-9-4.8 MCG/ACT AERO Inhale 2 puffs into the lungs 2 (two) times daily.   calcium-vitamin D (OSCAL WITH D) 500-200 MG-UNIT tablet Take 1 tablet by mouth.   ibandronate (BONIVA) 150 MG tablet Take 1 tablet (150 mg total) by mouth every 30 (thirty) days. Take in the morning with a full glass of water, on an empty stomach, and do not take anything else by mouth or lie down for the next 30 min.   omeprazole (PRILOSEC) 20 MG capsule Take 20 mg by mouth daily.   [DISCONTINUED] atorvastatin (LIPITOR) 10 MG tablet Take 1 tablet (10 mg total) by mouth daily.   atorvastatin (LIPITOR) 10 MG tablet Take 1 tablet (10 mg total) by mouth daily.   No  facility-administered encounter medications on file as of 02/09/2021.    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   COPD (chronic obstructive pulmonary disease) (HCC)    Pneumonia    Shortness of breath dyspnea     Family History: Family History  Problem Relation Age of Onset   Breast cancer Mother    COPD Father    Emphysema Father     Social History   Socioeconomic History   Marital status: Married    Spouse name: Not on file   Number of children: Not on file   Years of education: Not on file   Highest education level: Not on file  Occupational History   Not on file  Tobacco Use   Smoking status: Every Day    Packs/day: 1.00    Years: 40.00    Pack years: 40.00    Types: Cigarettes   Smokeless tobacco: Never  Substance and Sexual Activity   Alcohol use: Yes    Alcohol/week: 7.0 standard drinks    Types: 7 Cans of beer per week    Comment: socially   Drug use: No   Sexual activity: Not on file  Other Topics Concern   Not on file  Social History Narrative   Not on file   Social Determinants  of Health   Financial Resource Strain: Not on file  Food Insecurity: Not on file  Transportation Needs: Not on file  Physical Activity: Not on file  Stress: Not on file  Social Connections: Not on file  Intimate Partner Violence: Not on file      Review of Systems  Constitutional: Negative.  Negative for chills, fatigue and unexpected weight change.  HENT: Negative.  Negative for congestion, rhinorrhea, sneezing and sore throat.   Eyes:  Negative for redness.  Respiratory:  Positive for cough (smoker's cough). Negative for chest tightness, shortness of breath and wheezing.   Cardiovascular: Negative.  Negative for chest pain and palpitations.  Gastrointestinal:  Negative for abdominal pain,  constipation, diarrhea, nausea and vomiting.  Genitourinary:  Negative for dysuria and frequency.  Musculoskeletal:  Negative for arthralgias, back pain, joint swelling and neck pain.  Skin:  Negative for rash.  Neurological: Negative.  Negative for tremors and numbness.  Hematological:  Negative for adenopathy. Does not bruise/bleed easily.  Psychiatric/Behavioral:  Negative for behavioral problems (Depression), sleep disturbance and suicidal ideas. The patient is not nervous/anxious.    Vital Signs: BP 136/88   Pulse 97   Temp 98.2 F (36.8 C)   Resp 16   Ht 5' (1.524 m)   Wt 120 lb 3.2 oz (54.5 kg)   SpO2 96%   BMI 23.47 kg/m    Physical Exam Vitals reviewed.  Constitutional:      General: She is not in acute distress.    Appearance: Normal appearance. She is normal weight. She is not ill-appearing.  HENT:     Head: Normocephalic and atraumatic.  Eyes:     Extraocular Movements: Extraocular movements intact.     Pupils: Pupils are equal, round, and reactive to light.  Cardiovascular:     Rate and Rhythm: Normal rate and regular rhythm.     Heart sounds: Normal heart sounds. No murmur heard. Pulmonary:     Effort: Pulmonary effort is normal. No respiratory distress.     Breath sounds: Normal breath sounds. No wheezing.  Neurological:     Mental Status: She is alert and oriented to person, place, and time.     Cranial Nerves: No cranial nerve deficit.     Coordination: Coordination normal.     Gait: Gait normal.  Psychiatric:        Mood and Affect: Mood normal.        Behavior: Behavior normal.       Assessment/Plan: 1. Other emphysema ([place]) PFT continues to show severe obstructive lung disease with minimal improvement.   2. Aortic atherosclerosis (HCC) Continue atorvastatin as prescribed. Carotid ultrasound was done in January 2022. - atorvastatin (LIPITOR) 10 MG tablet; Take 1 tablet (10 mg total) by mouth daily.  Dispense: 90 tablet; Refill: 3  3. Very  heavy cigarette smoker Declines smoking cessation counseling. She is aware of the risks and has no intention    General Counseling: Elenna verbalizes understanding of the findings of todays visit and agrees with plan of treatment. I have discussed any further diagnostic evaluation that may be needed or ordered today. We also reviewed her medications today. she has been encouraged to call the office with any questions or concerns that should arise related to todays visit.    No orders of the defined types were placed in this encounter.   Meds ordered this encounter  Medications   atorvastatin (LIPITOR) 10 MG tablet    Sig: Take 1 tablet (10 mg  total) by mouth daily.    Dispense:  90 tablet    Refill:  3    Return in about 6 months (around 08/10/2021) for F/U, pulmonary only w/ DSK or lauren.   Total time spent:30 Minutes Time spent includes review of chart, medications, test results, and follow up plan with the patient.   [place] Controlled Substance Database was reviewed by me.  This patient was seen by Jonetta Osgood, FNP-C in collaboration with Dr. Clayborn Bigness as a part of collaborative care agreement.   Bonnie Overdorf R. Valetta Fuller, MSN, FNP-C Internal medicine

## 2021-03-03 ENCOUNTER — Telehealth: Payer: Self-pay

## 2021-03-03 NOTE — Telephone Encounter (Signed)
Husband came into office for appt and requested samples of breztri, gave him samples for patient

## 2021-05-17 IMAGING — MG MM DIGITAL DIAGNOSTIC UNILAT*R* W/ TOMO W/ CAD
4 series · 4 of 12 positions shown · non-contrast
Comparison: Screening mammogram dated 09/20/2019

CLINICAL DATA: Screening recall from baseline mammography for a
cm oval circumscribed mass in the upper inner right breast.

EXAM:
DIGITAL DIAGNOSTIC RIGHT MAMMOGRAM WITH CAD AND TOMO
ULTRASOUND RIGHT BREAST

[R MLO synth-2D]
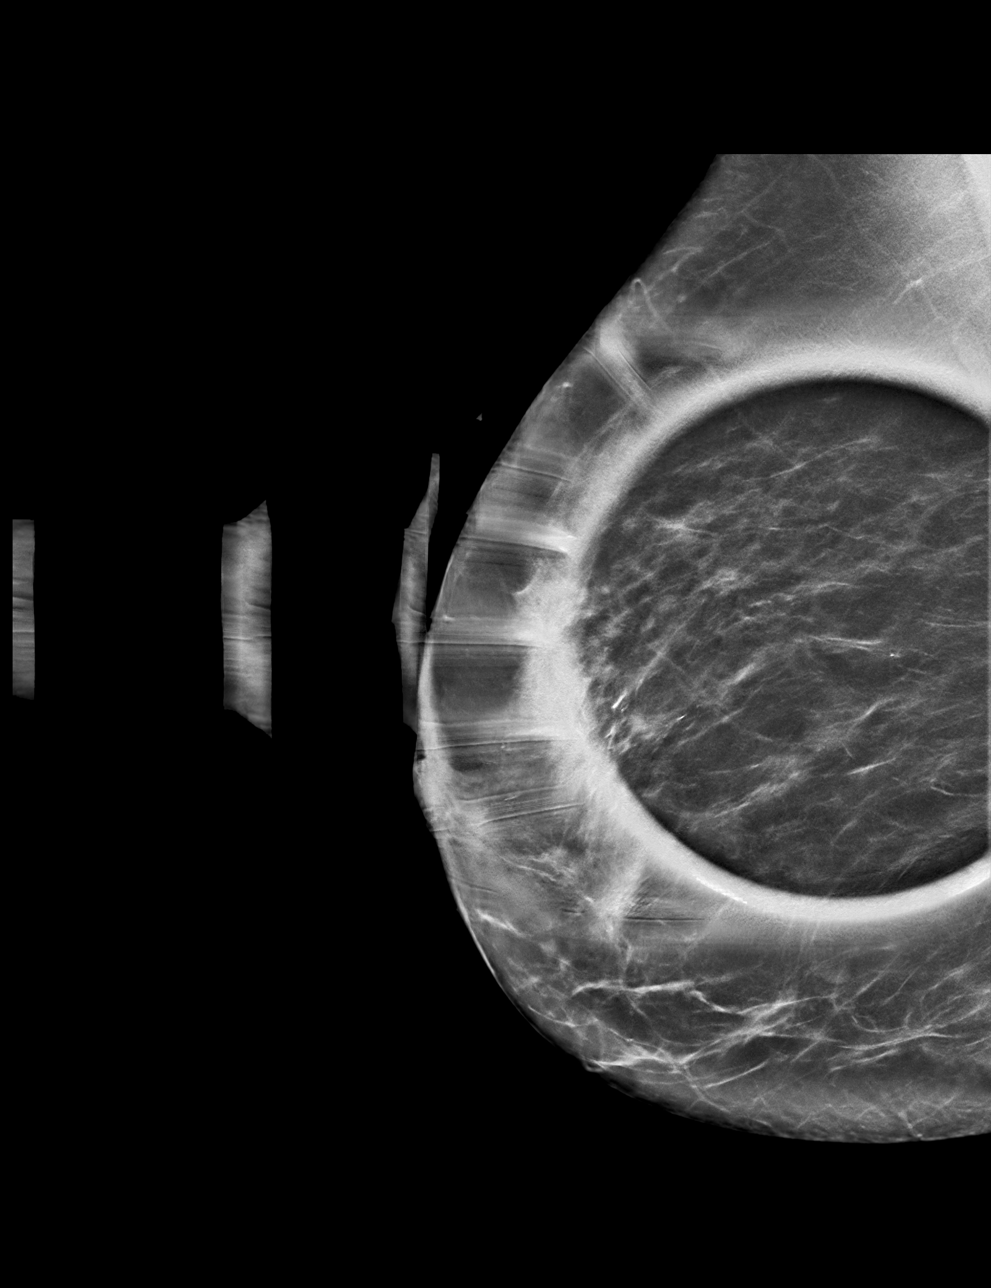

[R CC synth-2D]
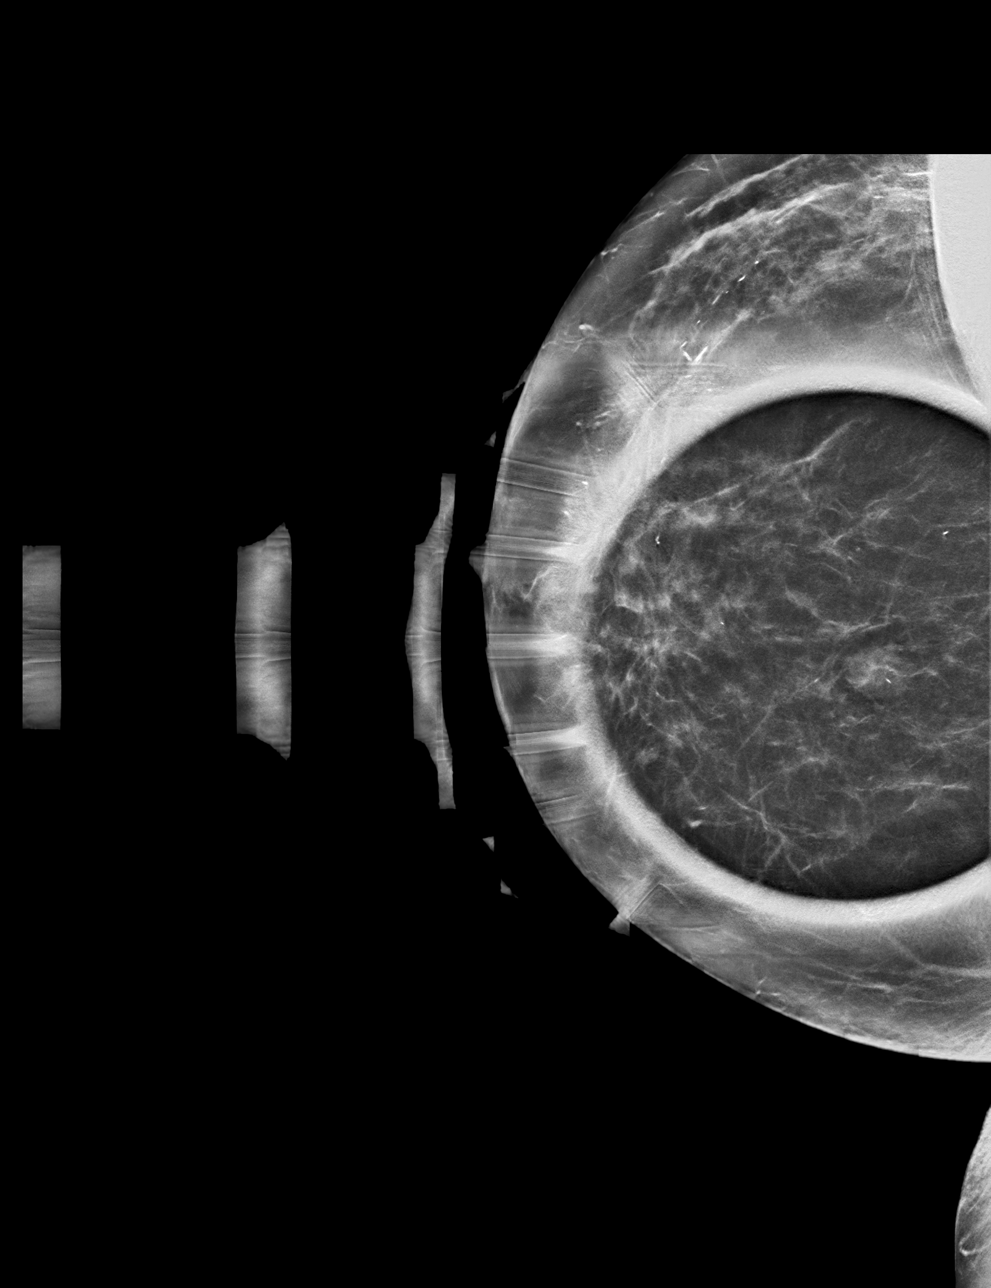

[R MLO tomo · tomo slice 37/73.0]
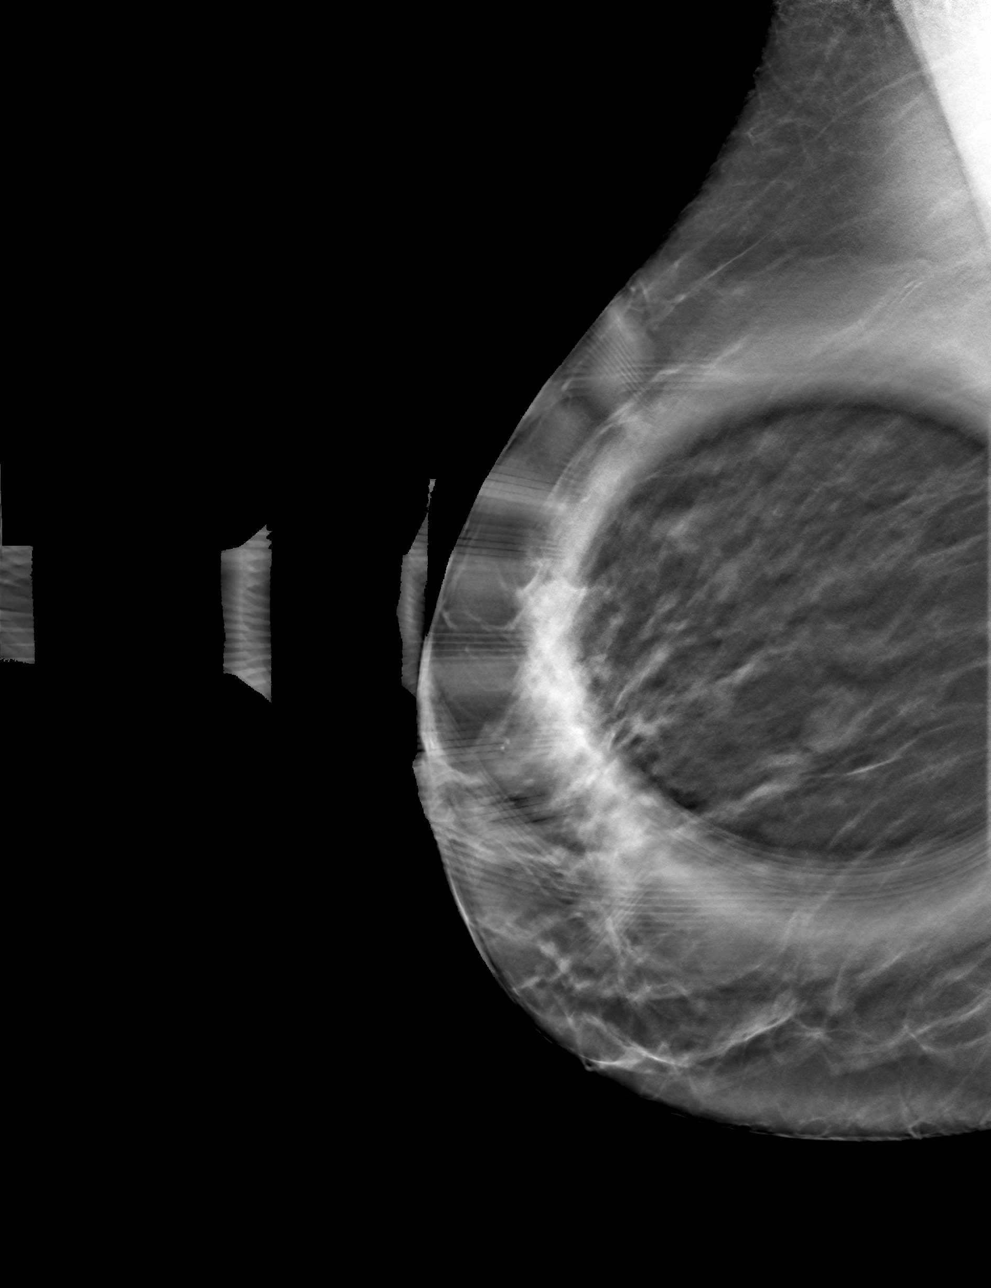

[R CC tomo · tomo slice 31/61.0]
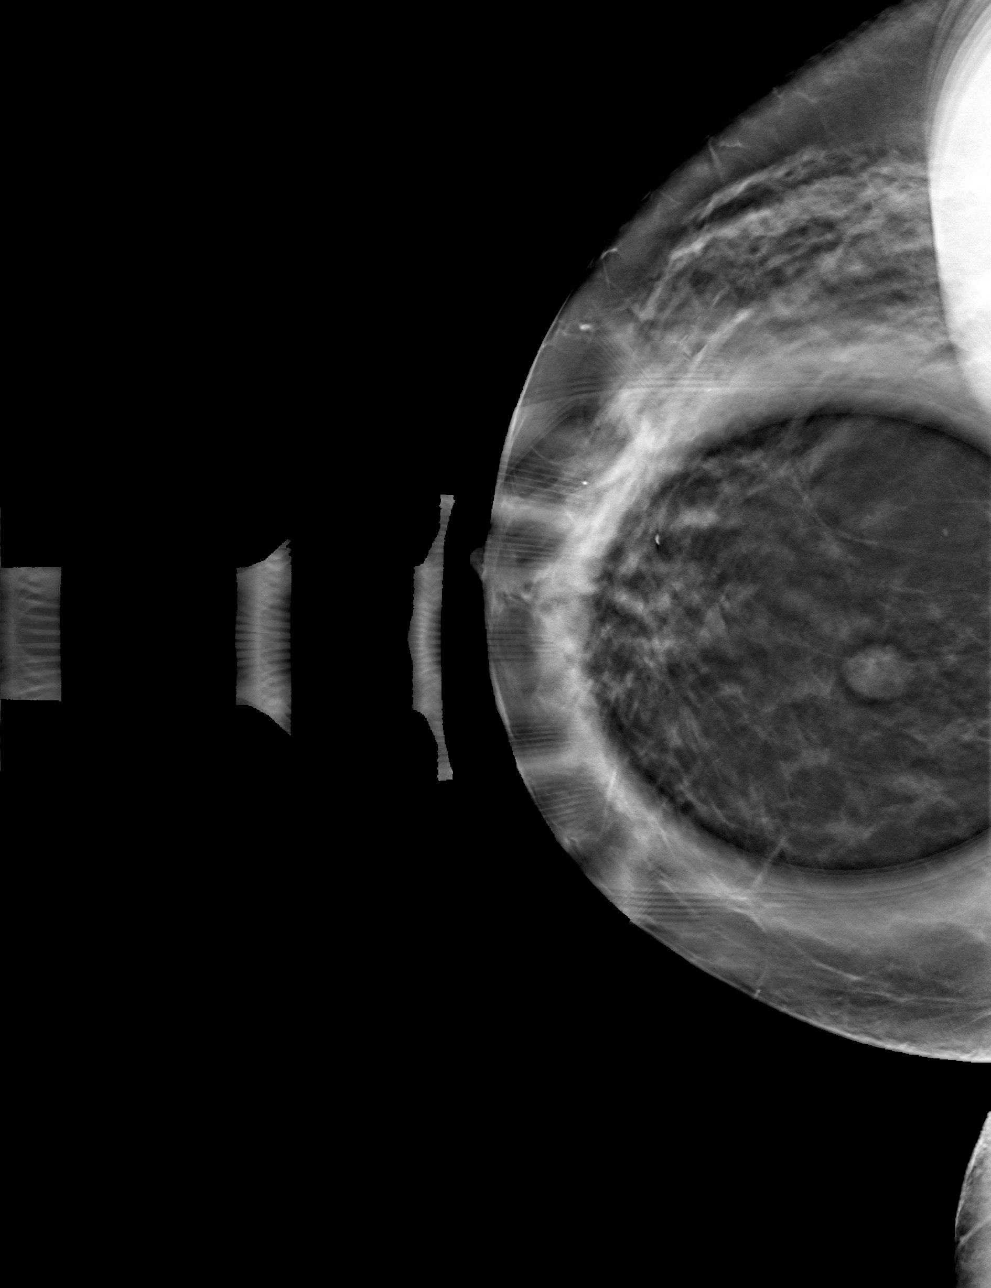

[4 of 12 positions shown; findings below may reference images not displayed]

ACR Breast Density Category b: There are scattered areas of
fibroglandular density.
FINDINGS: Spot compression tomograms were performed of the upper inner right
breast demonstrating an oval circumscribed 1.3 cm mass.

Mammographic images were processed with CAD.

Targeted ultrasound of the upper right breast was performed. There
is an oval circumscribed hypoechoic mass at the 1 o'clock position 3
cm from nipple measuring 0.9 x 0.5 x 1.2 cm. This corresponds well
with the mass seen in the upper right breast at mammography. This is
also felt to correspond well with an oval circumscribed mass seen in
the superior right breast on chest CT dated 09/06/2014 and likely
stable however difficult to compare due to different modalities.
IMPRESSION: Probably benign right breast mass.

RECOMMENDATION:
Right breast ultrasound in 6 months.

I have discussed the findings and recommendations with the patient.
If applicable, a reminder letter will be sent to the patient
regarding the next appointment.

BI-RADS CATEGORY  3: Probably benign.

## 2021-05-25 ENCOUNTER — Ambulatory Visit: Payer: Medicare HMO | Admitting: Nurse Practitioner

## 2021-08-10 ENCOUNTER — Ambulatory Visit: Payer: Medicare HMO | Admitting: Physician Assistant

## 2021-08-12 ENCOUNTER — Ambulatory Visit: Payer: Medicare HMO | Admitting: Nurse Practitioner

## 2021-08-12 ENCOUNTER — Encounter: Payer: Self-pay | Admitting: Nurse Practitioner

## 2021-08-12 VITALS — BP 120/70 | HR 76 | Temp 98.4°F | Resp 16 | Ht 60.0 in | Wt 120.0 lb

## 2021-08-12 DIAGNOSIS — F17210 Nicotine dependence, cigarettes, uncomplicated: Secondary | ICD-10-CM

## 2021-08-12 DIAGNOSIS — R0602 Shortness of breath: Secondary | ICD-10-CM

## 2021-08-12 DIAGNOSIS — J449 Chronic obstructive pulmonary disease, unspecified: Secondary | ICD-10-CM

## 2021-08-12 DIAGNOSIS — I700 Atherosclerosis of aorta: Secondary | ICD-10-CM

## 2021-08-12 DIAGNOSIS — J4489 Other specified chronic obstructive pulmonary disease: Secondary | ICD-10-CM

## 2021-08-12 NOTE — Progress Notes (Signed)
[facility] ?[address] ?[place], [place] [address] ? ?Internal MEDICINE  ?Office Visit Note ? ?Patient Name: Kelly Curry ? [id number]  ?[id number] ? ?Date of Service: 08/12/2021 ? ?Chief Complaint  ?Patient presents with  ? Follow-up  ? COPD  ? ? ?HPI ?Kelly Curry presents for a follow up visit for COPD. She did spirometry in office. Her FVC and FEV1 have improved slightly when compared to her previous spirometry. She continues to smoke cigarettes, approx 1.5 ppd. She reports having increased stress in her life lately. She understands the risks of continuing to smoke cigarettes and possible complications.  ?She is due for PFT  and CT chest in June. She requested to have both ordered at her wellness visit in June.  ? ? ? ?Current Medication: ?Outpatient Encounter Medications as of 08/12/2021  ?Medication Sig  ? acetaminophen (TYLENOL) 500 MG tablet Take 500 mg by mouth every 4 (four) hours as needed.  ? albuterol (PROVENTIL) (2.5 MG/3ML) 0.083% nebulizer solution Take 3 mLs (2.5 mg total) by nebulization every 4 (four) hours as needed for wheezing or shortness of breath.  ? APPLE CIDER VINEGAR PO Take by mouth.  ? aspirin EC 81 MG tablet Take 81 mg by mouth daily. Swallow whole.  ? atorvastatin (LIPITOR) 10 MG tablet Take 1 tablet (10 mg total) by mouth daily.  ? Budeson-Glycopyrrol-Formoterol (BREZTRI AEROSPHERE) 160-9-4.8 MCG/ACT AERO Inhale 2 puffs into the lungs 2 (two) times daily.  ? ibandronate (BONIVA) 150 MG tablet Take 1 tablet (150 mg total) by mouth every 30 (thirty) days. Take in the morning with a full glass of water, on an empty stomach, and do not take anything else by mouth or lie down for the next 30 min.  ? omeprazole (PRILOSEC) 20 MG capsule Take 20 mg by mouth daily.  ? [DISCONTINUED] calcium-vitamin D (OSCAL WITH D) 500-200 MG-UNIT tablet Take 1 tablet by mouth. (Patient not taking: Reported on 08/12/2021)  ? ?No facility-administered encounter medications on file as of 08/12/2021.   ? ? ?Surgical History: ?Past Surgical History:  ?Procedure Laterality Date  ? LUNG BIOPSY  2016  ? TUBAL LIGATION    ? ? ?Medical History: ?Past Medical History:  ?Diagnosis Date  ? [facility])   ? lefft lung mass  ? COPD (chronic obstructive pulmonary disease) ([place])   ? Pneumonia   ? Shortness of breath dyspnea   ? ? ?Family History: ?Family History  ?Problem Relation Age of Onset  ? Breast cancer Mother   ? COPD Father   ? Emphysema Father   ? ? ?Social History  ? ?Socioeconomic History  ? Marital status: Married  ?  Spouse name: Not on file  ? Number of children: Not on file  ? Years of education: Not on file  ? Highest education level: Not on file  ?Occupational History  ? Not on file  ?Tobacco Use  ? Smoking status: Every Day  ?  Packs/day: 1.00  ?  Years: 40.00  ?  Pack years: 40.00  ?  Types: Cigarettes  ? Smokeless tobacco: Never  ?Substance and Sexual Activity  ? Alcohol use: Yes  ?  Alcohol/week: 7.0 standard drinks  ?  Types: 7 Cans of beer per week  ?  Comment: socially  ? Drug use: No  ? Sexual activity: Not on file  ?Other Topics Concern  ? Not on file  ?Social History Narrative  ? Not on file  ? ?Social Determinants of Health  ? 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 ? ? ? ?Review of Systems  ?Constitutional:  Positive for fatigue. Negative for chills and fever.  ?HENT: Negative.    ?Respiratory:  Positive for shortness of breath (intermittent). Negative for cough, chest tightness and wheezing.   ?Cardiovascular: Negative.  Negative for chest pain and palpitations.  ?Genitourinary: Negative.   ?Neurological: Negative.  Negative for headaches.  ? ?Vital Signs: ?BP (!) 160/94   Pulse 76   Temp 98.4 ?F (36.9 ?C)   Resp 16   Ht 5' (1.524 m)   Wt 120 lb (54.4 kg)   SpO2 99%   BMI 23.44 kg/m?   ? ? ?Physical Exam ?Vitals reviewed.  ?Constitutional:   ?   General: She is not in acute distress. ?   Appearance: Normal appearance. She is normal weight. She is not ill-appearing.  ?HENT:  ?   Head: Normocephalic and atraumatic.  ?Eyes:  ?   Pupils: Pupils are equal, round, and reactive to light.  ?Cardiovascular:  ?   Rate and Rhythm: Normal rate and regular rhythm.  ?   Heart sounds: Normal heart sounds. No murmur heard. ?Pulmonary:  ?   Effort: Pulmonary effort is normal. No respiratory distress.  ?   Breath sounds: Normal breath sounds. No wheezing.  ?Neurological:  ?   Mental Status: She is alert and oriented to person, place, and time.  ?Psychiatric:     ?   Mood and Affect: Mood normal.     ?   Behavior: Behavior normal.  ? ? ? ? ? ?Assessment/Plan: ?1. Obstructive chronic bronchitis without exacerbation ([place]) ?Takes breztri twice daily, has albuterol inhaler to take as needed.  ? ?2. SOB (shortness of breath) ?Mild SOB at baseline, spirometry slightly improved ?- Spirometry with Graph ? ?3. Aortic atherosclerosis ([place]) ?taking baby aspirin and atorvastatin ? ?4. Very heavy cigarette smoker ?Not interested in quitting smoking at this time. Acknowledges risks and possible complications of continuing smoking.  ? ? ?General Counseling: enedina pair understanding of the findings of todays visit and agrees with plan of treatment. I have discussed any further diagnostic evaluation that may be needed or ordered today. We also reviewed her medications today. she has been encouraged to call the office with any questions or concerns that should arise related to todays visit. ? ? ? ?Orders Placed This Encounter  ?Procedures  ? Spirometry with Graph  ? ? ?No orders of the defined types were placed in this encounter. ? ? ?Return in about 1 year (around 08/13/2022) for F/U, pulmonary only needs to be with Lauren for pulm. ? ? ?Total time spent:30 Minutes ?Time spent includes review of chart, medications, test  results, and follow up plan with the patient.  ? ?[place] Controlled Substance Database was reviewed by me. ? ?This patient was seen by Jonetta Osgood, FNP-C in collaboration with Dr. Clayborn Bigness as a part of collaborative care agreement. ? ? ?Idriss Quackenbush R. Valetta Fuller, MSN, FNP-C ?Internal medicine  ?

## 2021-08-19 ENCOUNTER — Other Ambulatory Visit: Payer: Self-pay | Admitting: Nurse Practitioner

## 2021-08-19 DIAGNOSIS — Z0189 Encounter for other specified special examinations: Secondary | ICD-10-CM

## 2021-08-19 DIAGNOSIS — Z0001 Encounter for general adult medical examination with abnormal findings: Secondary | ICD-10-CM

## 2021-08-19 DIAGNOSIS — E782 Mixed hyperlipidemia: Secondary | ICD-10-CM

## 2021-08-19 DIAGNOSIS — F17210 Nicotine dependence, cigarettes, uncomplicated: Secondary | ICD-10-CM

## 2021-08-19 DIAGNOSIS — N6310 Unspecified lump in the right breast, unspecified quadrant: Secondary | ICD-10-CM

## 2021-08-19 DIAGNOSIS — Z1231 Encounter for screening mammogram for malignant neoplasm of breast: Secondary | ICD-10-CM

## 2021-08-19 DIAGNOSIS — D229 Melanocytic nevi, unspecified: Secondary | ICD-10-CM

## 2021-08-19 DIAGNOSIS — E559 Vitamin D deficiency, unspecified: Secondary | ICD-10-CM

## 2021-08-19 DIAGNOSIS — R300 Dysuria: Secondary | ICD-10-CM

## 2021-08-19 DIAGNOSIS — K219 Gastro-esophageal reflux disease without esophagitis: Secondary | ICD-10-CM

## 2021-08-19 DIAGNOSIS — Z122 Encounter for screening for malignant neoplasm of respiratory organs: Secondary | ICD-10-CM

## 2021-08-19 DIAGNOSIS — Z1211 Encounter for screening for malignant neoplasm of colon: Secondary | ICD-10-CM

## 2021-10-12 ENCOUNTER — Encounter: Payer: Self-pay | Admitting: Nurse Practitioner

## 2021-10-12 ENCOUNTER — Ambulatory Visit (INDEPENDENT_AMBULATORY_CARE_PROVIDER_SITE_OTHER): Payer: Medicare HMO | Admitting: Nurse Practitioner

## 2021-10-12 ENCOUNTER — Other Ambulatory Visit: Payer: Self-pay | Admitting: Nurse Practitioner

## 2021-10-12 VITALS — BP 140/90 | HR 90 | Temp 97.8°F | Resp 16 | Ht 60.0 in | Wt 119.0 lb

## 2021-10-12 DIAGNOSIS — N6315 Unspecified lump in the right breast, overlapping quadrants: Secondary | ICD-10-CM | POA: Diagnosis not present

## 2021-10-12 DIAGNOSIS — N6310 Unspecified lump in the right breast, unspecified quadrant: Secondary | ICD-10-CM

## 2021-10-12 DIAGNOSIS — E559 Vitamin D deficiency, unspecified: Secondary | ICD-10-CM

## 2021-10-12 DIAGNOSIS — I700 Atherosclerosis of aorta: Secondary | ICD-10-CM

## 2021-10-12 DIAGNOSIS — F17210 Nicotine dependence, cigarettes, uncomplicated: Secondary | ICD-10-CM

## 2021-10-12 DIAGNOSIS — Z1231 Encounter for screening mammogram for malignant neoplasm of breast: Secondary | ICD-10-CM | POA: Diagnosis not present

## 2021-10-12 DIAGNOSIS — K219 Gastro-esophageal reflux disease without esophagitis: Secondary | ICD-10-CM

## 2021-10-12 DIAGNOSIS — Z122 Encounter for screening for malignant neoplasm of respiratory organs: Secondary | ICD-10-CM

## 2021-10-12 DIAGNOSIS — Z0189 Encounter for other specified special examinations: Secondary | ICD-10-CM

## 2021-10-12 DIAGNOSIS — J432 Centrilobular emphysema: Secondary | ICD-10-CM | POA: Diagnosis not present

## 2021-10-12 DIAGNOSIS — Z0001 Encounter for general adult medical examination with abnormal findings: Secondary | ICD-10-CM

## 2021-10-12 DIAGNOSIS — R300 Dysuria: Secondary | ICD-10-CM | POA: Diagnosis not present

## 2021-10-12 DIAGNOSIS — M816 Localized osteoporosis [Lequesne]: Secondary | ICD-10-CM

## 2021-10-12 DIAGNOSIS — E782 Mixed hyperlipidemia: Secondary | ICD-10-CM

## 2021-10-12 DIAGNOSIS — D229 Melanocytic nevi, unspecified: Secondary | ICD-10-CM

## 2021-10-12 DIAGNOSIS — M545 Low back pain, unspecified: Secondary | ICD-10-CM

## 2021-10-12 DIAGNOSIS — Z1211 Encounter for screening for malignant neoplasm of colon: Secondary | ICD-10-CM

## 2021-10-12 DIAGNOSIS — G8929 Other chronic pain: Secondary | ICD-10-CM

## 2021-10-12 MED ORDER — BREZTRI AEROSPHERE 160-9-4.8 MCG/ACT IN AERO: 2.0000 | INHALATION_SPRAY | Freq: Two times a day (BID) | RESPIRATORY_TRACT | 11 refills | Status: DC

## 2021-10-12 MED ORDER — IBANDRONATE SODIUM 150 MG PO TABS: 150.0000 mg | ORAL_TABLET | ORAL | 11 refills | Status: AC

## 2021-10-12 MED ORDER — METHOCARBAMOL 500 MG PO TABS: 500.0000 mg | ORAL_TABLET | Freq: Three times a day (TID) | ORAL | 1 refills | Status: DC | PRN

## 2021-10-12 MED ORDER — ATORVASTATIN CALCIUM 10 MG PO TABS: 10.0000 mg | ORAL_TABLET | Freq: Every day | ORAL | 3 refills | Status: AC

## 2021-10-12 NOTE — Progress Notes (Incomplete)
[facility] [place], [place] [address]  Internal MEDICINE  Office Visit Note  Patient Name: Kelly Curry  [id number]  [id number]  Date of Service: 10/12/2021  Chief Complaint  Patient presents with  . Medicare Wellness    HPI Kelly Curry presents for an annual well visit and physical exam.  She is a well-appearing 66 year old female with COPD, GERD, hyperlipidemia, chronic back pain and osteoporosis.  She will be due in August for her mammogram and breast ultrasound on the right side.  She is still due for colorectal cancer screening, discussed Cologuard versus colonoscopy and she declined for now and agreed to discuss the screening at her next annual visit in 2024.  She continues to smoke cigarettes which she reports is mostly due to the fact that her husband continues to smoke cigarettes and she reports that she smokes 1-1/2 to 2 packs/day.  She reports that she does drink alcohol but not regularly may be once or twice a month.  She had labs drawn last year and they were stable, her cholesterol levels improved significantly last year she would like to hold off on doing labs for now and agreed to have labs drawn at her yearly exam next year.     Current Medication: Outpatient Encounter Medications as of 10/12/2021  Medication Sig  . acetaminophen (TYLENOL) 500 MG tablet Take 500 mg by mouth every 4 (four) hours as needed.  Marland Kitchen albuterol (PROVENTIL) (2.5 MG/3ML) 0.083% nebulizer solution Take 3 mLs (2.5 mg total) by nebulization every 4 (four) hours as needed for wheezing or shortness of breath.  . APPLE CIDER VINEGAR PO Take by mouth.  Marland Kitchen aspirin EC 81 MG tablet Take 81 mg by mouth daily. Swallow whole.  . methocarbamol (ROBAXIN) 500 MG tablet Take 1 tablet (500 mg total) by mouth every 8 (eight) hours as needed for muscle spasms.  Marland Kitchen omeprazole (PRILOSEC) 20 MG capsule Take 20 mg by mouth daily.  . [DISCONTINUED] atorvastatin (LIPITOR) 10 MG tablet Take 1 tablet  (10 mg total) by mouth daily.  . [DISCONTINUED] Budeson-Glycopyrrol-Formoterol (BREZTRI AEROSPHERE) 160-9-4.8 MCG/ACT AERO Inhale 2 puffs into the lungs 2 (two) times daily.  . [DISCONTINUED] ibandronate (BONIVA) 150 MG tablet TAKE 1 TABLET BY MOUTH ONCE MONTHLY. DO.NOT LIE DOWN, EAT FOOD, OR TAKE ANY. OTHER MEDS FOR LEAST 60 MINUTES. TAKE INTHE MORNING WITH FULL GLASS OF WATER  . atorvastatin (LIPITOR) 10 MG tablet Take 1 tablet (10 mg total) by mouth daily.  . Budeson-Glycopyrrol-Formoterol (BREZTRI AEROSPHERE) 160-9-4.8 MCG/ACT AERO Inhale 2 puffs into the lungs 2 (two) times daily.  Marland Kitchen ibandronate (BONIVA) 150 MG tablet Take 1 tablet (150 mg total) by mouth every 30 (thirty) days. Take in the morning with a full glass of water, on an empty stomach, and do not take anything else by mouth or lie down for the next 30 min.   No facility-administered encounter medications on file as of 10/12/2021.    Surgical History: Past Surgical History:  Procedure Laterality Date  . LUNG BIOPSY  2016  . TUBAL LIGATION      Medical History: Past Medical History:  Diagnosis Date  . 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 COPD (chronic obstructive pulmonary disease) ([place])   . Pneumonia   . Shortness of breath dyspnea     Family History: Family History  Problem Relation Age of Onset  . Breast cancer Mother   . COPD Father   . Emphysema Father  Social History   Socioeconomic History  . Marital status: Married    Spouse name: Not on file  . Number of children: Not on file  . Years of education: Not on file  . Highest education level: Not on file  Occupational History  . Not on file  Tobacco Use  . Smoking status: Every Day    Packs/day: 1.00    Years: 40.00    Total pack years: 40.00    Types: Cigarettes  . Smokeless tobacco: Never  . Tobacco comments:    1.5 packs daily  Substance and Sexual Activity  . Alcohol use: Yes    Alcohol/week: 7.0 standard drinks of alcohol    Types: 7  Cans of beer per week    Comment: socially  . Drug use: No  . Sexual activity: Not on file  Other Topics Concern  . Not on file  Social History Narrative  . Not on file   Social Determinants of Health   Financial Resource Strain: Not on file  Food Insecurity: Not on file  Transportation Needs: Not on file  Physical Activity: Not on file  Stress: Not on file  Social Connections: Not on file  Intimate Partner Violence: Not on file      Review of Systems  Constitutional:  Negative for activity change, appetite change, chills, fatigue, fever and unexpected weight change.  HENT: Negative.  Negative for congestion, ear pain, rhinorrhea, sore throat and trouble swallowing.   Eyes: Negative.   Respiratory: Negative.  Negative for cough, chest tightness, shortness of breath and wheezing.        Intermittent due to COPD ad continued smoking - cough, wheezing, and SOB. Denies any issues today.   Cardiovascular: Negative.  Negative for chest pain.  Gastrointestinal: Negative.  Negative for abdominal pain, blood in stool, constipation, diarrhea, nausea and vomiting.  Endocrine: Negative.   Genitourinary: Negative.  Negative for difficulty urinating, dysuria, frequency, hematuria and urgency.  Musculoskeletal: Negative.  Negative for arthralgias, back pain, joint swelling, myalgias and neck pain.  Skin: Negative.  Negative for rash and wound.  Allergic/Immunologic: Negative.  Negative for immunocompromised state.  Neurological: Negative.  Negative for dizziness, seizures, numbness and headaches.  Hematological: Negative.   Psychiatric/Behavioral: Negative.  Negative for behavioral problems, self-injury and suicidal ideas. The patient is not nervous/anxious.     Vital Signs: BP (!) 146/90   Pulse 90   Temp 97.8 F (36.6 C)   Resp 16   Ht 5' (1.524 m)   Wt 119 lb (54 kg)   SpO2 97%   BMI 23.24 kg/m    Physical Exam Vitals reviewed.  Constitutional:      General: She is awake.  She is not in acute distress.    Appearance: Normal appearance. She is well-developed and well-groomed. She is not ill-appearing or diaphoretic.  HENT:     Head: Normocephalic and atraumatic.     Right Ear: Tympanic membrane, ear canal and external ear normal.     Left Ear: Tympanic membrane, ear canal and external ear normal.     Nose: Nose normal. No congestion or rhinorrhea.     Mouth/Throat:     Lips: Pink.     Mouth: Mucous membranes are moist.     Pharynx: Oropharynx is clear. Uvula midline. No oropharyngeal exudate.  Eyes:     General: Lids are normal. Vision grossly intact. Gaze aligned appropriately. No scleral icterus.       Right eye: No discharge.  Left eye: No discharge.     Conjunctiva/sclera: Conjunctivae normal.     Pupils: Pupils are equal, round, and reactive to light.     Funduscopic exam:    Right eye: Red reflex present.        Left eye: Red reflex present. Neck:     Thyroid: No thyromegaly.     Vascular: No JVD.     Trachea: No tracheal deviation.  Cardiovascular:     Rate and Rhythm: Normal rate and regular rhythm.     Pulses: Normal pulses.     Heart sounds: Normal heart sounds. No murmur heard.    No friction rub. No gallop.  Pulmonary:     Effort: Pulmonary effort is normal. No respiratory distress.     Breath sounds: Normal breath sounds. No stridor. No wheezing or rales.  Chest:     Chest wall: No tenderness.  Abdominal:     General: Bowel sounds are normal. There is no distension.     Palpations: Abdomen is soft. There is no mass.     Tenderness: There is no abdominal tenderness. There is no guarding or rebound.  Musculoskeletal:        General: No tenderness or deformity. Normal range of motion.     Cervical back: Normal range of motion and neck supple.  Lymphadenopathy:     Cervical: No cervical adenopathy.  Skin:    General: Skin is warm and dry.     Coloration: Skin is not pale.     Findings: No erythema or rash.  Neurological:      Mental Status: She is alert and oriented to person, place, and time.     Cranial Nerves: No cranial nerve deficit.     Motor: No abnormal muscle tone.     Coordination: Coordination normal.     Deep Tendon Reflexes: Reflexes are normal and symmetric.  Psychiatric:        Mood and Affect: Mood normal.        Behavior: Behavior normal. Behavior is cooperative.        Thought Content: Thought content normal.        Judgment: Judgment normal.       Assessment/Plan: 1. Dysuria - UA/M w/rflx Culture, Routine - ibandronate (BONIVA) 150 MG tablet; Take 1 tablet (150 mg total) by mouth every 30 (thirty) days. Take in the morning with a full glass of water, on an empty stomach, and do not take anything else by mouth or lie down for the next 30 min.  Dispense: 1 tablet; Refill: 11  2. Encounter for screening mammogram for malignant neoplasm of breast  3. Mass overlapping multiple quadrants of right breast - MM DIAG BREAST TOMO BILATERAL; Future - US BREAST LTD UNI RIGHT INC AXILLA; Future  4. Breast mass, right - ibandronate (BONIVA) 150 MG tablet; Take 1 tablet (150 mg total) by mouth every 30 (thirty) days. Take in the morning with a full glass of water, on an empty stomach, and do not take anything else by mouth or lie down for the next 30 min.  Dispense: 1 tablet; Refill: 11  5. Gastroesophageal reflux disease without esophagitis - ibandronate (BONIVA) 150 MG tablet; Take 1 tablet (150 mg total) by mouth every 30 (thirty) days. Take in the morning with a full glass of water, on an empty stomach, and do not take anything else by mouth or lie down for the next 30 min.  Dispense: 1 tablet; Refill: 11  6. Encounter for  screening for lung cancer - ibandronate (BONIVA) 150 MG tablet; Take 1 tablet (150 mg total) by mouth every 30 (thirty) days. Take in the morning with a full glass of water, on an empty stomach, and do not take anything else by mouth or lie down for the next 30 min.  Dispense: 1  tablet; Refill: 11  7. Mixed hyperlipidemia - ibandronate (BONIVA) 150 MG tablet; Take 1 tablet (150 mg total) by mouth every 30 (thirty) days. Take in the morning with a full glass of water, on an empty stomach, and do not take anything else by mouth or lie down for the next 30 min.  Dispense: 1 tablet; Refill: 11  8. Very heavy cigarette smoker - ibandronate (BONIVA) 150 MG tablet; Take 1 tablet (150 mg total) by mouth every 30 (thirty) days. Take in the morning with a full glass of water, on an empty stomach, and do not take anything else by mouth or lie down for the next 30 min.  Dispense: 1 tablet; Refill: 11  9. Encounter for general adult medical examination with abnormal findings - ibandronate (BONIVA) 150 MG tablet; Take 1 tablet (150 mg total) by mouth every 30 (thirty) days. Take in the morning with a full glass of water, on an empty stomach, and do not take anything else by mouth or lie down for the next 30 min.  Dispense: 1 tablet; Refill: 11  10. Vitamin D deficiency - ibandronate (BONIVA) 150 MG tablet; Take 1 tablet (150 mg total) by mouth every 30 (thirty) days. Take in the morning with a full glass of water, on an empty stomach, and do not take anything else by mouth or lie down for the next 30 min.  Dispense: 1 tablet; Refill: 11  11. Encounter for screening mammogram for breast cancer - ibandronate (BONIVA) 150 MG tablet; Take 1 tablet (150 mg total) by mouth every 30 (thirty) days. Take in the morning with a full glass of water, on an empty stomach, and do not take anything else by mouth or lie down for the next 30 min.  Dispense: 1 tablet; Refill: 11  12. Screening for colorectal cancer - ibandronate (BONIVA) 150 MG tablet; Take 1 tablet (150 mg total) by mouth every 30 (thirty) days. Take in the morning with a full glass of water, on an empty stomach, and do not take anything else by mouth or lie down for the next 30 min.  Dispense: 1 tablet; Refill: 11  13. Encounter for  routine laboratory testing - ibandronate (BONIVA) 150 MG tablet; Take 1 tablet (150 mg total) by mouth every 30 (thirty) days. Take in the morning with a full glass of water, on an empty stomach, and do not take anything else by mouth or lie down for the next 30 min.  Dispense: 1 tablet; Refill: 11  14. Benign mole - ibandronate (BONIVA) 150 MG tablet; Take 1 tablet (150 mg total) by mouth every 30 (thirty) days. Take in the morning with a full glass of water, on an empty stomach, and do not take anything else by mouth or lie down for the next 30 min.  Dispense: 1 tablet; Refill: 11  15. Aortic atherosclerosis (HCC) - atorvastatin (LIPITOR) 10 MG tablet; Take 1 tablet (10 mg total) by mouth daily.  Dispense: 90 tablet; Refill: 3     General Counseling: Blinda verbalizes understanding of the findings of todays visit and agrees with plan of treatment. I have discussed any further diagnostic evaluation that may  be needed or ordered today. We also reviewed her medications today. she has been encouraged to call the office with any questions or concerns that should arise related to todays visit.    Orders Placed This Encounter  Procedures  . MM DIAG BREAST TOMO BILATERAL  . US BREAST LTD UNI RIGHT INC AXILLA  . UA/M w/rflx Culture, Routine    Meds ordered this encounter  Medications  . methocarbamol (ROBAXIN) 500 MG tablet    Sig: Take 1 tablet (500 mg total) by mouth every 8 (eight) hours as needed for muscle spasms.    Dispense:  90 tablet    Refill:  1    New medication  . ibandronate (BONIVA) 150 MG tablet    Sig: Take 1 tablet (150 mg total) by mouth every 30 (thirty) days. Take in the morning with a full glass of water, on an empty stomach, and do not take anything else by mouth or lie down for the next 30 min.    Dispense:  1 tablet    Refill:  11  . Budeson-Glycopyrrol-Formoterol (BREZTRI AEROSPHERE) 160-9-4.8 MCG/ACT AERO    Sig: Inhale 2 puffs into the lungs 2 (two) times  daily.    Dispense:  10.7 g    Refill:  11  . atorvastatin (LIPITOR) 10 MG tablet    Sig: Take 1 tablet (10 mg total) by mouth daily.    Dispense:  90 tablet    Refill:  3    Return in about 1 year (around 10/13/2022) for CPE, Alonte Wulff PCP.   Total time spent:*** Minutes Time spent includes review of chart, medications, test results, and follow up plan with the patient.   [place] Controlled Substance Database was reviewed by me.  This patient was seen by Jonetta Osgood, FNP-C in collaboration with Dr. Clayborn Bigness as a part of collaborative care agreement.  Rayhan Groleau R. Valetta Fuller, MSN, FNP-C Internal medicine

## 2021-10-13 ENCOUNTER — Encounter: Payer: Self-pay | Admitting: Nurse Practitioner

## 2021-10-15 LAB — UA/M W/RFLX CULTURE, ROUTINE
Bilirubin, UA: NEGATIVE
Glucose, UA: NEGATIVE
Ketones, UA: NEGATIVE
Leukocytes,UA: NEGATIVE
Nitrite, UA: NEGATIVE
Protein,UA: NEGATIVE
RBC, UA: NEGATIVE
Specific Gravity, UA: 1.006 (ref 1.005–1.030)
Urobilinogen, Ur: 0.2 mg/dL (ref 0.2–1.0)
pH, UA: 5.5 (ref 5.0–7.5)

## 2021-10-15 LAB — MICROSCOPIC EXAMINATION
Casts: NONE SEEN /lpf
Epithelial Cells (non renal): NONE SEEN /hpf (ref 0–10)
RBC, Urine: NONE SEEN /hpf (ref 0–2)
WBC, UA: NONE SEEN /hpf (ref 0–5)

## 2021-10-15 LAB — URINE CULTURE, REFLEX

## 2021-10-15 LAB — SPECIMEN STATUS REPORT

## 2021-11-02 ENCOUNTER — Telehealth: Payer: Self-pay

## 2021-11-02 ENCOUNTER — Other Ambulatory Visit: Payer: Self-pay

## 2021-11-02 MED ORDER — PREDNISONE 10 MG PO TABS: ORAL_TABLET | ORAL | 0 refills | Status: DC

## 2021-11-02 MED ORDER — AZITHROMYCIN 250 MG PO TABS: ORAL_TABLET | ORAL | 0 refills | Status: DC

## 2021-11-02 NOTE — Telephone Encounter (Signed)
Pt called that ear infection,sore throat,cough more like bronchitis and covid test is negative offer pt appt to come she is unable to come in advised her as per dr Humphrey Rolls send zpak and prednisone and need follow up appt in 2 weeks

## 2021-11-10 ENCOUNTER — Emergency Department: Payer: Medicare HMO

## 2021-11-10 ENCOUNTER — Emergency Department
Admission: EM | Admit: 2021-11-10 | Discharge: 2021-11-10 | Disposition: A | Discharge status: Home / Self Care | Payer: Medicare HMO | Attending: Emergency Medicine | Admitting: Emergency Medicine

## 2021-11-10 DIAGNOSIS — F17200 Nicotine dependence, unspecified, uncomplicated: Secondary | ICD-10-CM | POA: Insufficient documentation

## 2021-11-10 DIAGNOSIS — R000 Tachycardia, unspecified: Secondary | ICD-10-CM | POA: Diagnosis not present

## 2021-11-10 DIAGNOSIS — J441 Chronic obstructive pulmonary disease with (acute) exacerbation: Secondary | ICD-10-CM | POA: Diagnosis not present

## 2021-11-10 DIAGNOSIS — R062 Wheezing: Secondary | ICD-10-CM | POA: Diagnosis not present

## 2021-11-10 DIAGNOSIS — R069 Unspecified abnormalities of breathing: Secondary | ICD-10-CM | POA: Diagnosis not present

## 2021-11-10 DIAGNOSIS — R0600 Dyspnea, unspecified: Secondary | ICD-10-CM | POA: Diagnosis not present

## 2021-11-10 DIAGNOSIS — R0602 Shortness of breath: Secondary | ICD-10-CM | POA: Diagnosis not present

## 2021-11-10 DIAGNOSIS — R059 Cough, unspecified: Secondary | ICD-10-CM | POA: Diagnosis not present

## 2021-11-10 DIAGNOSIS — J189 Pneumonia, unspecified organism: Secondary | ICD-10-CM | POA: Diagnosis not present

## 2021-11-10 DIAGNOSIS — J449 Chronic obstructive pulmonary disease, unspecified: Secondary | ICD-10-CM | POA: Diagnosis not present

## 2021-11-10 DIAGNOSIS — R0689 Other abnormalities of breathing: Secondary | ICD-10-CM | POA: Diagnosis not present

## 2021-11-10 LAB — CBC
HCT: 30.9 % — ABNORMAL LOW (ref 36.0–46.0)
Hemoglobin: 10.1 g/dL — ABNORMAL LOW (ref 12.0–15.0)
MCH: 29.1 pg (ref 26.0–34.0)
MCHC: 32.7 g/dL (ref 30.0–36.0)
MCV: 89 fL (ref 80.0–100.0)
Platelets: 453 10*3/uL — ABNORMAL HIGH (ref 150–400)
RBC: 3.47 MIL/uL — ABNORMAL LOW (ref 3.87–5.11)
RDW: 13.3 % (ref 11.5–15.5)
WBC: 21.2 10*3/uL — ABNORMAL HIGH (ref 4.0–10.5)
nRBC: 0 % (ref 0.0–0.2)

## 2021-11-10 LAB — BASIC METABOLIC PANEL
Anion gap: 8 (ref 5–15)
BUN: 12 mg/dL (ref 8–23)
CO2: 29 mmol/L (ref 22–32)
Calcium: 7.8 mg/dL — ABNORMAL LOW (ref 8.9–10.3)
Chloride: 92 mmol/L — ABNORMAL LOW (ref 98–111)
Creatinine, Ser: 0.77 mg/dL (ref 0.44–1.00)
GFR, Estimated: >60 mL/min (ref >60)
Glucose, Bld: 98 mg/dL (ref 70–99)
Potassium: 2.9 mmol/L — ABNORMAL LOW (ref 3.5–5.1)
Sodium: 129 mmol/L — ABNORMAL LOW (ref 135–145)

## 2021-11-10 LAB — LACTIC ACID, PLASMA: Lactic Acid, Venous: 1 mmol/L (ref 0.5–1.9)

## 2021-11-10 MED ORDER — METHYLPREDNISOLONE 4 MG PO TBPK: ORAL_TABLET | ORAL | 0 refills | Status: DC

## 2021-11-10 MED ORDER — IPRATROPIUM-ALBUTEROL 20-100 MCG/ACT IN AERS
1.0000 | INHALATION_SPRAY | Freq: Once | RESPIRATORY_TRACT | Status: AC
  Administered 2021-11-10: 1 via RESPIRATORY_TRACT
  Filled 2021-11-10: qty 4

## 2021-11-10 MED ORDER — IOHEXOL 350 MG/ML SOLN
75.0000 mL | Freq: Once | INTRAVENOUS | Status: AC | PRN
  Administered 2021-11-10: 75 mL via INTRAVENOUS

## 2021-11-10 MED ORDER — COMBIVENT RESPIMAT 20-100 MCG/ACT IN AERS: 1.0000 | INHALATION_SPRAY | Freq: Four times a day (QID) | RESPIRATORY_TRACT | 0 refills | Status: DC

## 2021-11-10 MED ORDER — AEROCHAMBER PLUS FLO-VU LARGE MISC
1.0000 | Freq: Once | Status: DC
  Filled 2021-11-10: qty 1

## 2021-11-10 MED ORDER — DOXYCYCLINE HYCLATE 50 MG PO CAPS: 100.0000 mg | ORAL_CAPSULE | Freq: Two times a day (BID) | ORAL | 0 refills | Status: AC

## 2021-11-10 MED ORDER — SODIUM CHLORIDE 0.9 % IV BOLUS
1000.0000 mL | Freq: Once | INTRAVENOUS | Status: AC
  Administered 2021-11-10: 1000 mL via INTRAVENOUS

## 2021-11-10 NOTE — ED Provider Notes (Signed)
[facility] Provider Note   Event Date/Time   First MD Initiated Contact with Patient 11/10/21 Doran Heater     (approximate) History  Shortness of Breath  HPI Kelly Curry is a 66 y.o. female with a stated past medical history of COPD and continued tobacco abuse who presents for shortness of breath over the last 2 weeks.  Patient states that 1 week prior to arrival she was seen by her primary care physician and prescribed azithromycin and prednisone.  Patient states that she had initial improvement in her symptoms however the symptoms began to worsen even after the second day of treatment and has been worsening since then.  Per EMS, patient was significantly hypoxic on exertion to 85% on room air.  Patient does not wear oxygen at home.  Patient states that she has been using her home controller inhalers and taking her medications on time and as prescribed.  Also endorses associated central chest tightness over this time as well. ROS: Patient currently denies any vision changes, tinnitus, difficulty speaking, facial droop, sore throat, abdominal pain, nausea/vomiting/diarrhea, dysuria, or weakness/numbness/paresthesias in any extremity   Physical Exam  Triage Vital Signs: ED Triage Vitals  Enc Vitals Group     BP 11/10/21 1920 (!) 174/99     Pulse Rate 11/10/21 1918 (!) 105     Resp 11/10/21 1918 19     Temp 11/10/21 1918 98.4 F (36.9 C)     Temp Source 11/10/21 1918 Oral     SpO2 11/10/21 1918 97 %     Weight 11/10/21 1919 119 lb (54 kg)     Height 11/10/21 1919 5' (1.524 m)     Head Circumference --      Peak Flow --      Pain Score --      Pain Loc --      Pain Edu? --      Excl. in [place]? --    Most recent vital signs: Vitals:   11/10/21 2100 11/10/21 2215  BP: (!) 163/105 (!) 170/100  Pulse: 96 93  Resp: (!) 24 (!) 21  Temp:    SpO2: 94% 97%   General: Awake, oriented x4. CV:  Good peripheral perfusion.  Resp:  Increased effort.  Inspiratory and  expiratory wheezes over bilateral lung fields Abd:  No distention.  Other:  Elderly Caucasian female laying in bed in mild respiratory distress ED Results / Procedures / Treatments  Labs (all labs ordered are listed, but only abnormal results are displayed) Labs Reviewed  BASIC METABOLIC PANEL - Abnormal; Notable for the following components:      Result Value   Sodium 129 (*)    Potassium 2.9 (*)    Chloride 92 (*)    Calcium 7.8 (*)    All other components within normal limits  CBC - Abnormal; Notable for the following components:   WBC 21.2 (*)    RBC 3.47 (*)    Hemoglobin 10.1 (*)    HCT 30.9 (*)    Platelets 453 (*)    All other components within normal limits  LACTIC ACID, PLASMA   EKG ED ECG REPORT I, Naaman Plummer, the attending physician, personally viewed and interpreted this ECG. Date: 11/10/2021 EKG Time: 1928 Rate: 101 Rhythm: Tachycardic sinus rhythm QRS Axis: normal Intervals: normal ST/T Wave abnormalities: normal Narrative Interpretation: Tachycardic sinus rhythm.  No evidence of acute ischemia RADIOLOGY ED MD interpretation: 2 view chest x-ray interpreted by me shows no  evidence of acute abnormalities including no pneumonia, pneumothorax, or widened mediastinum -Agree with radiology assessment Official radiology report(s): No results found. PROCEDURES: Critical Care performed: No Procedures MEDICATIONS ORDERED IN ED: Medications  sodium chloride 0.9 % bolus 1,000 mL (0 mLs Intravenous Stopped 11/10/21 2230)  Ipratropium-Albuterol (COMBIVENT) respimat 1 puff (1 puff Inhalation Given 11/10/21 2255)  iohexol (OMNIPAQUE) 350 MG/ML injection 75 mL (75 mLs Intravenous Contrast Given 11/10/21 2200)   IMPRESSION / MDM / ASSESSMENT AND PLAN / ED COURSE  I reviewed the triage vital signs and the nursing notes.                             The patient is on the cardiac monitor to evaluate for evidence of arrhythmia and/or significant heart rate  changes. Patient's presentation is most consistent with severe exacerbation of chronic illness. The patient appears to be suffering from a moderate exacerbation of COPD.  Based on the history, exam, CXR/EKG, and further workup I dont suspect any other emergent cause of this presentation, such as pneumonia, acute coronary syndrome, congestive heart failure, pulmonary embolism, or pneumothorax.  ED Interventions: bronchodilators, steroids, antibiotics, reassess   Reassessment: After treatment, the patients shortness of breath is resolved, and their lung exam has returned to baseline. They are comfortable and want to go home. Per note from 08/12/2021, NP [place] and pulmonology notes patient has had difficulty getting her home medications secondary to financial issues and therefore will provide patient with follow-up for the medication management clinic Rx: Steroids, Antibiotics Disposition: Discharge home with SRP. PCP follow up recommended in next 48hours.   FINAL CLINICAL IMPRESSION(S) / ED DIAGNOSES   Final diagnoses:  COPD exacerbation ([place])   Rx / DC Orders   ED Discharge Orders          Ordered    methylPREDNISolone (MEDROL DOSEPAK) 4 MG TBPK tablet        11/10/21 2220    doxycycline (VIBRAMYCIN) 50 MG capsule  2 times daily        11/10/21 2220    Ipratropium-Albuterol (COMBIVENT RESPIMAT) 20-100 MCG/ACT AERS respimat  Every 6 hours        11/10/21 2220           Note:  This document was prepared using Dragon voice recognition software and may include unintentional dictation errors.   Naaman Plummer, MD 11/14/21 1352

## 2021-11-10 NOTE — ED Triage Notes (Addendum)
Pt brought in per Ems from home for c/o SOB and cough concerned that she might have PNA. PMHX:COPD. Reports had fever over a week ago and was given Z-pack from PCP.  Duoneb 68ml, Solumedrol 125mg  IV given en route per EMS.  Pt presents to ED AAOx4, respi even-unlabored. In nad

## 2021-11-10 NOTE — ED Notes (Signed)
ED Provider at bedside. 

## 2021-11-11 ENCOUNTER — Other Ambulatory Visit: Payer: Self-pay

## 2021-11-11 ENCOUNTER — Telehealth: Payer: Self-pay

## 2021-11-11 MED ORDER — NYSTATIN 100000 UNIT/ML MT SUSP: 5.0000 mL | Freq: Four times a day (QID) | OROMUCOSAL | 0 refills | Status: DC

## 2021-11-11 NOTE — Telephone Encounter (Signed)
Per [facility] send nystatin take 50mls by mouth 4 times daily swish and swallow. LMOM to make pt aware

## 2021-11-11 NOTE — Telephone Encounter (Signed)
Spoke to pt's husband and informed him med was sent to pharmacy for thrush for pt

## 2021-11-11 NOTE — Telephone Encounter (Signed)
Lvm to schedule ED follow up-Toni 

## 2021-11-12 ENCOUNTER — Telehealth: Payer: Self-pay

## 2021-11-12 NOTE — Telephone Encounter (Signed)
Called patient to schedule ED f/u. She stated she was fine and will just see provider when she comes in for 11/26/21 appointment-Toni

## 2021-11-23 ENCOUNTER — Telehealth: Payer: Self-pay

## 2021-11-23 NOTE — Telephone Encounter (Signed)
Patient called because her insurance is no longer covering her Breztri. She thinks this could be due to the hospital putting her on Combivant on 11/10/2021. She says she is willing to stop her Combivant in order to start getting her Judithann Sauger again. Pt was given 4 week supply of Breztri that she will pick up by Thursday, 11/26/2021. Desha will call insurance in the meantime.

## 2021-11-25 ENCOUNTER — Telehealth: Payer: Self-pay

## 2021-11-25 NOTE — Telephone Encounter (Signed)
Called [facility] about the [employer] for pt and they advised that the medication needs a PA.  Then the pharmacist can override it to get prescription if approved.  Pharmacist is faxing information to [place].

## 2021-11-26 ENCOUNTER — Encounter: Payer: Self-pay | Admitting: Nurse Practitioner

## 2021-11-26 ENCOUNTER — Ambulatory Visit (INDEPENDENT_AMBULATORY_CARE_PROVIDER_SITE_OTHER): Payer: Medicare HMO | Admitting: Nurse Practitioner

## 2021-11-26 VITALS — BP 137/65 | HR 98 | Temp 98.5°F | Resp 16 | Ht 60.0 in | Wt 116.2 lb

## 2021-11-26 DIAGNOSIS — R0602 Shortness of breath: Secondary | ICD-10-CM | POA: Diagnosis not present

## 2021-11-26 DIAGNOSIS — F17219 Nicotine dependence, cigarettes, with unspecified nicotine-induced disorders: Secondary | ICD-10-CM | POA: Diagnosis not present

## 2021-11-26 DIAGNOSIS — J432 Centrilobular emphysema: Secondary | ICD-10-CM

## 2021-11-26 MED ORDER — BREZTRI AEROSPHERE 160-9-4.8 MCG/ACT IN AERO: 2.0000 | INHALATION_SPRAY | Freq: Two times a day (BID) | RESPIRATORY_TRACT | 11 refills | Status: DC

## 2021-11-26 NOTE — Progress Notes (Signed)
[facility] [place], [place] [address]  Internal MEDICINE  Office Visit Note  Patient Name: Kelly Curry  [id number]  [id number]  Date of Service: 11/26/2021  Chief Complaint  Patient presents with   Follow-up    Pt wants to get her maintenance inhaler back.    COPD   Shortness of Breath   Fatigue    HPI [name] presents for a follow up visit for COPD, SOB, and fatigue.  --COPD -- recent ER visit on 7/18 for COPD exacerbation -- treated with medrol dosepack, doxycycline and prescribed combivent inhaler. The prescription for combivent from the ER has somehow kicked her breztri inhaler off her medication list and her insurance is requiring a new prior authorization if it is to be covered. She was provided with 4 weeks of breztri samples while this is being worked on.  --SOB -- last spiro in April this year showed moderate airway obstruction. 97% on room air today.  --every day smoker -- 1-1.5 ppd x46 years; she is interested in smoking but states that she will not quit smoking as long as her husband is still smoking. Confirms that if he quits, she would too.     Current Medication: Outpatient Encounter Medications as of 11/26/2021  Medication Sig   acetaminophen (TYLENOL) 500 MG tablet Take 500 mg by mouth every 4 (four) hours as needed.   albuterol (PROVENTIL) (2.5 MG/3ML) 0.083% nebulizer solution Take 3 mLs (2.5 mg total) by nebulization every 4 (four) hours as needed for wheezing or shortness of breath.   APPLE CIDER VINEGAR PO Take by mouth.   aspirin EC 81 MG tablet Take 81 mg by mouth daily. Swallow whole.   atorvastatin (LIPITOR) 10 MG tablet Take 1 tablet (10 mg total) by mouth daily.   azithromycin (ZITHROMAX) 250 MG tablet Use as directed for 5 days   ibandronate (BONIVA) 150 MG tablet Take 1 tablet (150 mg total) by mouth every 30 (thirty) days. Take in the morning with a full glass of water, on an empty stomach, and do not take anything else by  mouth or lie down for the next 30 min.   methylPREDNISolone (MEDROL DOSEPAK) 4 MG TBPK tablet Per package dirctions   nystatin (MYCOSTATIN) 100000 UNIT/ML suspension Take 5 mLs (500,000 Units total) by mouth 4 (four) times daily. Swish and swallow   omeprazole (PRILOSEC) 20 MG capsule Take 20 mg by mouth daily.   predniSONE (DELTASONE) 10 MG tablet Take 1 tab po 3 x day for 3 days then take 1 tab po 2 x a day for 3 days and then take 1 tab po daily for 3 days   [DISCONTINUED] Budeson-Glycopyrrol-Formoterol (BREZTRI AEROSPHERE) 160-9-4.8 MCG/ACT AERO Inhale 2 puffs into the lungs 2 (two) times daily.   [DISCONTINUED] Ipratropium-Albuterol (COMBIVENT RESPIMAT) 20-100 MCG/ACT AERS respimat Inhale 1 puff into the lungs every 6 (six) hours.   [DISCONTINUED] methocarbamol (ROBAXIN) 500 MG tablet Take 1 tablet (500 mg total) by mouth every 8 (eight) hours as needed for muscle spasms.   [DISCONTINUED] Budeson-Glycopyrrol-Formoterol (BREZTRI AEROSPHERE) 160-9-4.8 MCG/ACT AERO Inhale 2 puffs into the lungs 2 (two) times daily.   No facility-administered encounter medications on file as of 11/26/2021.    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   COPD (chronic obstructive  pulmonary disease) ([place])    Pneumonia    Shortness of breath dyspnea     Family History: Family History  Problem Relation Age of Onset   Breast cancer Mother    COPD Father    Emphysema Father     Social History   Socioeconomic History   Marital status: Married    Spouse name: Not on file   Number of children: Not on file   Years of education: Not on file   Highest education level: Not on file  Occupational History   Not on file  Tobacco Use   Smoking status: Every Day    Packs/day: 1.00    Years: 46.00    Total pack years: 46.00    Types: Cigarettes    Smokeless tobacco: Never   Tobacco comments:    1.5 packs daily  Substance and Sexual Activity   Alcohol use: Yes    Alcohol/week: 7.0 standard drinks of alcohol    Types: 7 Cans of beer per week    Comment: socially   Drug use: No   Sexual activity: Not on file  Other Topics Concern   Not on file  Social History Narrative   Not on file   Social Determinants of Health   Financial Resource Strain: Not on file  Food Insecurity: Not on file  Transportation Needs: Not on file  Physical Activity: Not on file  Stress: Not on file  Social Connections: Not on file  Intimate Partner Violence: Not on file      Review of Systems  Constitutional:  Positive for fatigue. Negative for chills and unexpected weight change.  HENT:  Positive for postnasal drip. Negative for congestion, rhinorrhea, sneezing and sore throat.   Eyes:  Negative for redness.  Respiratory:  Positive for shortness of breath (intermittent, improved since tx for exacerbation). Negative for cough, chest tightness and wheezing.   Cardiovascular: Negative.  Negative for chest pain and palpitations.  Gastrointestinal: Negative.  Negative for abdominal pain, constipation, diarrhea, nausea and vomiting.  Genitourinary:  Negative for dysuria and frequency.  Musculoskeletal:  Negative for arthralgias, back pain, joint swelling and neck pain.  Skin:  Negative for rash.  Neurological: Negative.  Negative for tremors and numbness.  Hematological:  Negative for adenopathy. Does not bruise/bleed easily.  Psychiatric/Behavioral:  Negative for behavioral problems (Depression), self-injury, sleep disturbance and suicidal ideas. The patient is nervous/anxious.     Vital Signs: BP 137/65   Pulse 98 Comment: 105  Temp 98.5 F (36.9 C)   Resp 16   Ht 5' (1.524 m)   Wt 116 lb 3.2 oz (52.7 kg)   SpO2 97%   BMI 22.69 kg/m    Physical Exam Vitals reviewed.  Constitutional:      General: She is not in acute distress.     Appearance: Normal appearance. She is well-developed and normal weight. She is not ill-appearing.  HENT:     Head: Normocephalic and atraumatic.  Eyes:     Pupils: Pupils are equal, round, and reactive to light.  Cardiovascular:     Rate and Rhythm: Normal rate and regular rhythm.     Heart sounds: Normal heart sounds. No murmur heard. Pulmonary:     Effort: Pulmonary effort is normal. No respiratory distress.     Breath sounds: Normal breath sounds. No wheezing.  Neurological:     Mental Status: She is alert and oriented to person, place, and time.  Psychiatric:        Mood  and Affect: Mood normal.        Behavior: Behavior normal.        Assessment/Plan: 1. Centrilobular emphysema ([place]) Breztri inhaler reordered, expediting prior authorization. Discontinue combivent, patient has 4 weeks of samples of breztri.   2. SOB (shortness of breath) Improved after medrol dosepack and with breztri for maintenance again.   3. Cigarette nicotine dependence with nicotine-induced disorder Briefly discussed smoking cessation; patient is ready but will only quit if her husband does and he is not ready so she confirms she will continue to smoke cigarettes.      General Counseling: makalynn berwanger understanding of the findings of todays visit and agrees with plan of treatment. I have discussed any further diagnostic evaluation that may be needed or ordered today. We also reviewed her medications today. she has been encouraged to call the office with any questions or concerns that should arise related to todays visit.    No orders of the defined types were placed in this encounter.   Meds ordered this encounter  Medications   DISCONTD: Budeson-Glycopyrrol-Formoterol (BREZTRI AEROSPHERE) 160-9-4.8 MCG/ACT AERO    Sig: Inhale 2 puffs into the lungs 2 (two) times daily.    Dispense:  10.7 g    Refill:  11    Discontinue combivent, please reinstate breztri prescription, if a new prior  Josem Kaufmann is needed, let us know asap and we will do it. Please send refill if she is due.    Return for CPE, [facility] PCP in june 2024, and as needed otherwise. .   Total time spent:30 Minutes Time spent includes review of chart, medications, test results, and follow up plan with the patient.   [place] Controlled Substance Database was reviewed by me.  This patient was seen by Jonetta Osgood, FNP-C in collaboration with Dr. Clayborn Bigness as a part of collaborative care agreement.   Willys Salvino R. Valetta Fuller, MSN, FNP-C Internal medicine

## 2021-11-29 ENCOUNTER — Telehealth: Payer: Self-pay

## 2021-11-29 DIAGNOSIS — J432 Centrilobular emphysema: Secondary | ICD-10-CM

## 2021-11-29 MED ORDER — BREZTRI AEROSPHERE 160-9-4.8 MCG/ACT IN AERO: 2.0000 | INHALATION_SPRAY | Freq: Two times a day (BID) | RESPIRATORY_TRACT | 11 refills | Status: AC

## 2021-11-29 NOTE — Telephone Encounter (Signed)
PA for BREZTRI 160-9-4.8 was sent 11/29/21 @ 10:03 pm and came back approved Valid 04/26/21 to 04/25/22  PA case # [id number]

## 2021-12-24 ENCOUNTER — Other Ambulatory Visit: Payer: Self-pay | Admitting: Nurse Practitioner

## 2021-12-24 ENCOUNTER — Ambulatory Visit
Admission: RE | Admit: 2021-12-24 | Discharge: 2021-12-24 | Disposition: A | Discharge status: Home / Self Care | Payer: Medicare HMO | Source: Ambulatory Visit | Attending: Nurse Practitioner | Admitting: Nurse Practitioner

## 2021-12-24 DIAGNOSIS — N6315 Unspecified lump in the right breast, overlapping quadrants: Secondary | ICD-10-CM | POA: Diagnosis not present

## 2021-12-24 DIAGNOSIS — Z1231 Encounter for screening mammogram for malignant neoplasm of breast: Secondary | ICD-10-CM

## 2021-12-24 DIAGNOSIS — R928 Other abnormal and inconclusive findings on diagnostic imaging of breast: Secondary | ICD-10-CM | POA: Diagnosis not present

## 2021-12-24 DIAGNOSIS — N6489 Other specified disorders of breast: Secondary | ICD-10-CM

## 2021-12-28 ENCOUNTER — Other Ambulatory Visit: Payer: Self-pay | Admitting: Nurse Practitioner

## 2021-12-28 DIAGNOSIS — R928 Other abnormal and inconclusive findings on diagnostic imaging of breast: Secondary | ICD-10-CM

## 2021-12-28 DIAGNOSIS — N6489 Other specified disorders of breast: Secondary | ICD-10-CM

## 2021-12-29 ENCOUNTER — Other Ambulatory Visit: Payer: Self-pay | Admitting: Nurse Practitioner

## 2021-12-29 DIAGNOSIS — G8929 Other chronic pain: Secondary | ICD-10-CM

## 2022-01-04 ENCOUNTER — Other Ambulatory Visit: Payer: Self-pay | Admitting: Nurse Practitioner

## 2022-01-04 DIAGNOSIS — R928 Other abnormal and inconclusive findings on diagnostic imaging of breast: Secondary | ICD-10-CM

## 2022-01-04 DIAGNOSIS — N6489 Other specified disorders of breast: Secondary | ICD-10-CM

## 2022-01-05 ENCOUNTER — Telehealth: Payer: Self-pay

## 2022-01-05 ENCOUNTER — Other Ambulatory Visit: Payer: Self-pay | Admitting: Nurse Practitioner

## 2022-01-05 DIAGNOSIS — N6489 Other specified disorders of breast: Secondary | ICD-10-CM

## 2022-01-05 DIAGNOSIS — R928 Other abnormal and inconclusive findings on diagnostic imaging of breast: Secondary | ICD-10-CM

## 2022-01-05 NOTE — Telephone Encounter (Signed)
Pt called checking on paperwork sent from [facility] for her to get a breast biopsy, per Vivien Rota it is being taken care of and will be completed and sent in to [facility] per [facility]

## 2022-01-09 ENCOUNTER — Encounter: Payer: Self-pay | Admitting: Nurse Practitioner

## 2022-01-29 ENCOUNTER — Ambulatory Visit
Admission: RE | Admit: 2022-01-29 | Discharge: 2022-01-29 | Disposition: A | Discharge status: Home / Self Care | Payer: Medicare HMO | Source: Ambulatory Visit | Attending: Nurse Practitioner | Admitting: Nurse Practitioner

## 2022-01-29 DIAGNOSIS — N6489 Other specified disorders of breast: Secondary | ICD-10-CM | POA: Diagnosis not present

## 2022-01-29 DIAGNOSIS — R928 Other abnormal and inconclusive findings on diagnostic imaging of breast: Secondary | ICD-10-CM | POA: Insufficient documentation

## 2022-01-29 DIAGNOSIS — N6081 Other benign mammary dysplasias of right breast: Secondary | ICD-10-CM | POA: Diagnosis not present

## 2022-01-29 HISTORY — PX: BREAST BIOPSY: SHX20

## 2022-02-01 LAB — SURGICAL PATHOLOGY

## 2022-02-02 ENCOUNTER — Telehealth: Payer: Self-pay

## 2022-02-02 ENCOUNTER — Encounter: Payer: Self-pay | Admitting: Internal Medicine

## 2022-02-02 ENCOUNTER — Ambulatory Visit (INDEPENDENT_AMBULATORY_CARE_PROVIDER_SITE_OTHER): Payer: Medicare HMO | Admitting: Internal Medicine

## 2022-02-02 VITALS — BP 121/68 | HR 100 | Temp 98.4°F | Resp 16 | Ht 60.0 in | Wt 116.2 lb

## 2022-02-02 DIAGNOSIS — I700 Atherosclerosis of aorta: Secondary | ICD-10-CM | POA: Diagnosis not present

## 2022-02-02 DIAGNOSIS — J9611 Chronic respiratory failure with hypoxia: Secondary | ICD-10-CM

## 2022-02-02 DIAGNOSIS — R928 Other abnormal and inconclusive findings on diagnostic imaging of breast: Secondary | ICD-10-CM

## 2022-02-02 DIAGNOSIS — J432 Centrilobular emphysema: Secondary | ICD-10-CM | POA: Diagnosis not present

## 2022-02-02 DIAGNOSIS — R911 Solitary pulmonary nodule: Secondary | ICD-10-CM

## 2022-02-02 NOTE — Progress Notes (Unsigned)
[facility], [place] [address]  Internal MEDICINE  Office Visit Note  Patient Name: Kelly Curry  [id number]  [id number]  Date of Service: 02/02/2022  Chief Complaint  Patient presents with   Follow-up    Here to review biopsy    HPI Pt is here for f/u She had breast biopsy and is here to confirm that she does not have any infection in her breast. She denies any pain or swelling of her biopsy site of right breast.there is no active bleed from site of biopsy. 2. Pt is noticed to be having SOB, she did give a history of getting  more short of breath than before over the last couple of months.  Denies any recent upper airway infection, patient has history of COPD on on [place] 2 puffs twice a day.  She did have an episode of shortness of breath and went to ED on 7/18 for evaluation and treatment her CT angiogram at the time did show moderate emphysema and 1 cm nodular density in right upper lobe which was unchanged from previous CT 3.  Patient continues to be a smoker Current Medication: Outpatient Encounter Medications as of 02/02/2022  Medication Sig   acetaminophen (TYLENOL) 500 MG tablet Take 500 mg by mouth every 4 (four) hours as needed.   albuterol (PROVENTIL) (2.5 MG/3ML) 0.083% nebulizer solution Take 3 mLs (2.5 mg total) by nebulization every 4 (four) hours as needed for wheezing or shortness of breath.   APPLE CIDER VINEGAR PO Take by mouth.   aspirin EC 81 MG tablet Take 81 mg by mouth daily. Swallow whole.   atorvastatin (LIPITOR) 10 MG tablet Take 1 tablet (10 mg total) by mouth daily.   Budeson-Glycopyrrol-Formoterol (BREZTRI AEROSPHERE) 160-9-4.8 MCG/ACT AERO Inhale 2 puffs into the lungs 2 (two) times daily.   ibandronate (BONIVA) 150 MG tablet Take 1 tablet (150 mg total) by mouth every 30 (thirty) days. Take in the morning with a full glass of water, on an empty stomach, and do not take anything else by mouth or lie down for the  next 30 min.   Magnesium 250 MG TABS Take 250 mg by mouth.   nystatin (MYCOSTATIN) 100000 UNIT/ML suspension Take 5 mLs (500,000 Units total) by mouth 4 (four) times daily. Swish and swallow   omeprazole (PRILOSEC) 20 MG capsule Take 20 mg by mouth daily.   [DISCONTINUED] azithromycin (ZITHROMAX) 250 MG tablet Use as directed for 5 days   [DISCONTINUED] methocarbamol (ROBAXIN) 500 MG tablet TAKE 1 TABLET BY MOUTH EVERY 8 HOURS AS NEEDED FOR MUSCLE SPASMS   [DISCONTINUED] methylPREDNISolone (MEDROL DOSEPAK) 4 MG TBPK tablet Per package dirctions   [DISCONTINUED] predniSONE (DELTASONE) 10 MG tablet Take 1 tab po 3 x day for 3 days then take 1 tab po 2 x a day for 3 days and then take 1 tab po daily for 3 days   No facility-administered encounter medications on file as of 02/02/2022.    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   COPD (chronic obstructive pulmonary disease) (H[facility])    Pneumonia    Shortness of breath dyspnea     Family History: Family History  Problem Relation Age  of Onset   Breast cancer Mother    COPD Father    Emphysema Father     Social History   Socioeconomic History   Marital status: Married    Spouse name: Not on file   Number of children: Not on file   Years of education: Not on file   Highest education level: Not on file  Occupational History   Not on file  Tobacco Use   Smoking status: Every Day    Packs/day: 1.00    Years: 46.00    Total pack years: 46.00    Types: Cigarettes   Smokeless tobacco: Never   Tobacco comments:    1.5 packs daily  Substance and Sexual Activity   Alcohol use: Yes    Alcohol/week: 7.0 standard drinks of alcohol    Types: 7 Cans of beer per week    Comment: socially   Drug use: No    Sexual activity: Not on file  Other Topics Concern   Not on file  Social History Narrative   Not on file   Social Determinants of Health   Financial Resource Strain: Not on file  Food Insecurity: Not on file  Transportation Needs: Not on file  Physical Activity: Not on file  Stress: Not on file  Social Connections: Not on file  Intimate Partner Violence: Not on file      Review of Systems  Constitutional:  Negative for fatigue and fever.  HENT:  Negative for congestion, mouth sores and postnasal drip.   Respiratory:  Positive for shortness of breath. Negative for cough.   Cardiovascular:  Negative for chest pain.  Genitourinary:  Negative for flank pain.  Psychiatric/Behavioral: Negative.      Vital Signs: BP 121/68   Pulse 100   Temp 98.4 F (36.9 C)   Resp 16   Ht 5' (1.524 m)   Wt 116 lb 3.2 oz (52.7 kg)   SpO2 99%   BMI 22.69 kg/m    Physical Exam Constitutional:      Appearance: Normal appearance.  HENT:     Head: Normocephalic and atraumatic.     Nose: Nose normal.     Mouth/Throat:     Mouth: Mucous membranes are moist.     Pharynx: No posterior oropharyngeal erythema.  Eyes:     Extraocular Movements: Extraocular movements intact.     Pupils: Pupils are equal, round, and reactive to light.  Cardiovascular:     Pulses: Normal pulses.     Heart sounds: Normal heart sounds.  Pulmonary:     Effort: Pulmonary effort is normal.     Breath sounds: Normal breath sounds.     Comments: Decreased basal breath sounds B/L Neurological:     General: No focal deficit present.     Mental Status: She is alert.  Psychiatric:        Mood and Affect: Mood normal.        Behavior: Behavior normal.    Right Breast results DIAGNOSIS:  A. BREAST, RIGHT UPPER OUTER QUADRANT; STEREOTACTIC CORE NEEDLE BIOPSY:  - BENIGN MAMMARY PARENCHYMA WITH FIBROCYSTIC AND APOCRINE CHANGES, AND  FOCAL USUAL DUCTAL HYPERPLASIA.  - FOCAL MICROCALCIFICATIONS ASSOCIATED WITH BENIGN  MAMMARY ELEMENTS.  - NEGATIVE FOR ATYPICAL PROLIFERATIVE BREAST DISEASE.  Assessment/Plan: 1. Chronic respiratory failure with hypoxia (HCC) Patient desaturated during 6-minute walk after 4 minutes dropped to 69%, she was started on oxygen 2 L right away, saturation improved to 98% after few minutes.patient was found to be tachycardic  during 6-minute walk however oxygen and heart rate both improved after starting her on oxygen oxygen(2L) through [facility] - For home use only DME oxygen - 6 minute walk  2. Abnormal finding on breast imaging Patient had biopsy done and the results are negative for any malignancy she will continue with her routine bilateral screening mammograms in future she can check for your help at home use giving given [facility] with a chronically and we really do not have to treat  3. Centrilobular emphysema ([place]) She continues to be dyspneic on exertion, hypoxic today and she will be started on oxygen 2 L nasal cannula, will continue on Breztri 2 puffs twice a day as before. Samples of Spiriva 2 puffs a day is also given to the patient, she continues to be a smoker Smoking cessation counseling: Pt acknowledges the risks of long term smoking, she will try to quite smoking. Options for different medications including nicotine products, chewing gum, patch etc, Wellbutrin and Chantix is discussed Goal and date of compete cessation is discussed Total time spent in smoking cessation is 10 min.   4. Aortic atherosclerosis ([place]) Will continue on Lipitor as before.   5. Lung nodule seen on imaging study Recent CT angio shows I cm mass/ or nodule, will need further evaluation by pulmonary. Might need a PET scan or direct Visualization    General Counseling: marieann zipp understanding of the findings of todays visit and agrees with plan of treatment. I have discussed any further diagnostic evaia

## 2022-02-02 NOTE — Telephone Encounter (Signed)
Sent oxygen order to [facility] on 02/02/2022.Waiting for response back.

## 2022-02-09 ENCOUNTER — Ambulatory Visit (INDEPENDENT_AMBULATORY_CARE_PROVIDER_SITE_OTHER): Payer: Medicare HMO | Admitting: Internal Medicine

## 2022-02-09 ENCOUNTER — Encounter: Payer: Self-pay | Admitting: Internal Medicine

## 2022-02-09 VITALS — BP 138/80 | HR 77 | Temp 98.2°F | Resp 16 | Ht 60.0 in | Wt 115.2 lb

## 2022-02-09 DIAGNOSIS — J432 Centrilobular emphysema: Secondary | ICD-10-CM | POA: Diagnosis not present

## 2022-02-09 DIAGNOSIS — R0602 Shortness of breath: Secondary | ICD-10-CM

## 2022-02-09 DIAGNOSIS — S2001XS Contusion of right breast, sequela: Secondary | ICD-10-CM

## 2022-02-09 DIAGNOSIS — J9611 Chronic respiratory failure with hypoxia: Secondary | ICD-10-CM | POA: Diagnosis not present

## 2022-02-09 MED ORDER — SPIRIVA RESPIMAT 2.5 MCG/ACT IN AERS: 2.0000 | INHALATION_SPRAY | Freq: Every day | RESPIRATORY_TRACT | 6 refills | Status: AC

## 2022-02-09 NOTE — Progress Notes (Signed)
[facility], [place] [address]  Internal MEDICINE  Office Visit Note  Patient Name: Kelly Curry  [id number]  [id number]  Date of Service: 02/12/2022  Chief Complaint  Patient presents with   Follow-up   Breast Problem    Wants biopsy checked once more    HPI Pt is here for routine follow up Concerned about bruising over right breast, just had a biopsy done, denies any fever or chills, area is mildly sore, no bleeding  She was started on O2 therapy due to Hypoxia, feels better, samples of Spiriva is given as well, she is on Breztri samples as well  Working on smoking cessation  Had abnormal CT in the past, increasing O2 requirement, will need to have a follow up study     Current Medication: Outpatient Encounter Medications as of 02/09/2022  Medication Sig   acetaminophen (TYLENOL) 500 MG tablet Take 500 mg by mouth every 4 (four) hours as needed.   albuterol (PROVENTIL) (2.5 MG/3ML) 0.083% nebulizer solution Take 3 mLs (2.5 mg total) by nebulization every 4 (four) hours as needed for wheezing or shortness of breath.   APPLE CIDER VINEGAR PO Take by mouth.   aspirin EC 81 MG tablet Take 81 mg by mouth daily. Swallow whole.   atorvastatin (LIPITOR) 10 MG tablet Take 1 tablet (10 mg total) by mouth daily.   Budeson-Glycopyrrol-Formoterol (BREZTRI AEROSPHERE) 160-9-4.8 MCG/ACT AERO Inhale 2 puffs into the lungs 2 (two) times daily.   ibandronate (BONIVA) 150 MG tablet Take 1 tablet (150 mg total) by mouth every 30 (thirty) days. Take in the morning with a full glass of water, on an empty stomach, and do not take anything else by mouth or lie down for the next 30 min.   Magnesium 250 MG TABS Take 250 mg by mouth.   nystatin (MYCOSTATIN) 100000 UNIT/ML suspension Take 5 mLs (500,000 Units total) by mouth 4 (four) times daily. Swish and swallow   omeprazole (PRILOSEC) 20 MG capsule Take 20 mg by mouth daily.   Tiotropium Bromide Monohydrate  (SPIRIVA RESPIMAT) 2.5 MCG/ACT AERS Inhale 2 puffs into the lungs daily.   No facility-administered encounter medications on file as of 02/09/2022.    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   COPD (chronic obstructive pulmonary disease) (HCC)    Pneumonia    Shortness of breath dyspnea     Family History: Family History  Problem Relation Age of Onset   Breast cancer Mother    COPD Father    Emphysema Father     Social History   Socioeconomic History   Marital status: Married    Spouse name: Not on file   Number of children: Not on file   Years of education: Not on file   Highest education level: Not on file  Occupational History   Not on file  Tobacco Use   Smoking status: Every Day    Packs/day: 1.00    Years: 46.00    Total pack years: 46.00    Types: Cigarettes   Smokeless tobacco: Never   Tobacco comments:    2 packs daily  Substance and Sexual Activity   Alcohol use: Yes  Alcohol/week: 7.0 standard drinks of alcohol    Types: 7 Cans of beer per week    Comment: socially   Drug use: No   Sexual activity: Not on file  Other Topics Concern   Not on file  Social History Narrative   Not on file   Social Determinants of Health   Financial Resource Strain: Not on file  Food Insecurity: Not on file  Transportation Needs: Not on file  Physical Activity: Not on file  Stress: Not on file  Social Connections: Not on file  Intimate Partner Violence: Not on file      Review of Systems  Constitutional:  Negative for chills, fatigue and unexpected weight change.  HENT:  Negative for congestion, postnasal drip, rhinorrhea, sneezing and sore throat.   Eyes:  Negative for redness.  Respiratory:  Positive for shortness of  breath. Negative for cough and chest tightness.   Cardiovascular:  Negative for chest pain and palpitations.  Gastrointestinal:  Negative for abdominal pain, constipation, diarrhea, nausea and vomiting.  Genitourinary:  Negative for dysuria and frequency.  Musculoskeletal:  Negative for arthralgias, back pain, joint swelling and neck pain.  Skin:  Negative for rash.  Neurological: Negative.  Negative for tremors and numbness.  Hematological:  Negative for adenopathy. Does not bruise/bleed easily.       Breast bruising/ hematoma   Psychiatric/Behavioral:  Negative for behavioral problems (Depression), sleep disturbance and suicidal ideas. The patient is not nervous/anxious.     Vital Signs: BP 138/80   Pulse 77   Temp 98.2 F (36.8 C)   Resp 16   Ht 5' (1.524 m)   Wt 115 lb 3.2 oz (52.3 kg)   SpO2 98%   BMI 22.50 kg/m    Physical Exam Constitutional:      Appearance: Normal appearance.  HENT:     Head: Normocephalic and atraumatic.     Nose: Nose normal.     Mouth/Throat:     Mouth: Mucous membranes are moist.     Pharynx: No posterior oropharyngeal erythema.  Eyes:     Extraocular Movements: Extraocular movements intact.     Pupils: Pupils are equal, round, and reactive to light.  Cardiovascular:     Pulses: Normal pulses.     Heart sounds: Normal heart sounds.  Pulmonary:     Breath sounds: Normal breath sounds. Decreased air movement present.  Chest:       Comments: Ecchymosis  and hematoma, slight tender, no sign of cellulitis  Neurological:     General: No focal deficit present.     Mental Status: She is alert.  Psychiatric:        Mood and Affect: Mood normal.        Behavior: Behavior normal.        Assessment/Plan: 1. SOB (shortness of breath) Pt continues to have SOB, ABNORMAL CT chest 3 months ago, will need a f/u study due to worsening symptoms  - CT Chest Wo Contrast; Future  2. Chronic respiratory failure with hypoxia (HCC) She is just  started on home O2 on previous visit, mild improvement seen, will continue  - CT Chest Wo Contrast; Future  3. Centrilobular emphysema ([place]) Continue on Breztri and samples of Spiriva is given, pt is worried about insurance coverage  - CT Chest Wo Contrast; Future - Tiotropium Bromide Monohydrate (SPIRIVA RESPIMAT) 2.5 MCG/ACT AERS; Inhale 2 puffs into the lungs daily.  Dispense: 1 each; Refill: 6  4. Posttraumatic hematoma of right breast, sequela  S/p biopsy of right breast, pt has ecchymosis and dependent bruising and small hematoma, no cellulitis, reassurance is given, did stress that pigmentation might take 4-8 weeks to resolve completely   General Counseling: Michele Mcalpine understanding of the findings of todays visit and agrees with plan of treatment. I have discussed any further diagnostic evaluation that may be needed or ordered today. We also reviewed her medications today. she has been encouraged to call the office with any questions or concerns that should arise related to todays visit.    Orders Placed This Encounter  Procedures   CT Chest Wo Contrast    Meds ordered this encounter  Medications   Tiotropium Bromide Monohydrate (SPIRIVA RESPIMAT) 2.5 MCG/ACT AERS    Sig: Inhale 2 puffs into the lungs daily.    Dispense:  1 each    Refill:  6    Total time spent:35 Minutes Time spent includes review of chart, medications, test results, and follow up plan with the patient.   [place] Controlled Substance Database was reviewed by me.   Dr Lavera Guise Internal medicine

## 2022-02-19 ENCOUNTER — Telehealth: Payer: Self-pay

## 2022-02-19 NOTE — Telephone Encounter (Signed)
Patient's Spiriva prior authorization was approved.

## 2022-03-02 ENCOUNTER — Telehealth: Payer: Self-pay

## 2022-03-02 NOTE — Telephone Encounter (Signed)
Patient's P.A. for [place] was approved.

## 2022-03-12 ENCOUNTER — Ambulatory Visit
Admission: RE | Admit: 2022-03-12 | Discharge: 2022-03-12 | Disposition: A | Discharge status: Home / Self Care | Payer: Medicare HMO | Source: Ambulatory Visit | Attending: Internal Medicine | Admitting: Internal Medicine

## 2022-03-12 DIAGNOSIS — J432 Centrilobular emphysema: Secondary | ICD-10-CM | POA: Diagnosis not present

## 2022-03-12 DIAGNOSIS — J984 Other disorders of lung: Secondary | ICD-10-CM | POA: Diagnosis not present

## 2022-03-12 DIAGNOSIS — J9611 Chronic respiratory failure with hypoxia: Secondary | ICD-10-CM | POA: Insufficient documentation

## 2022-03-12 DIAGNOSIS — R0602 Shortness of breath: Secondary | ICD-10-CM | POA: Insufficient documentation

## 2022-03-15 ENCOUNTER — Telehealth: Payer: Self-pay

## 2022-03-15 ENCOUNTER — Other Ambulatory Visit: Payer: Self-pay | Admitting: Physician Assistant

## 2022-03-15 DIAGNOSIS — J984 Other disorders of lung: Secondary | ICD-10-CM

## 2022-03-15 NOTE — Telephone Encounter (Signed)
Pt advised for CT chest result that getting worse we order PET SCAN  and then see need appt with dr Devona Konig and gave call to [facility] for schedule appt

## 2022-03-15 NOTE — Telephone Encounter (Signed)
Done see other note

## 2022-03-29 ENCOUNTER — Ambulatory Visit: Payer: Medicare HMO | Admitting: Physician Assistant

## 2022-03-29 ENCOUNTER — Ambulatory Visit
Admission: RE | Admit: 2022-03-29 | Discharge: 2022-03-29 | Disposition: A | Discharge status: Home / Self Care | Payer: Medicare HMO | Source: Ambulatory Visit | Attending: Physician Assistant | Admitting: Physician Assistant

## 2022-03-29 DIAGNOSIS — J984 Other disorders of lung: Secondary | ICD-10-CM | POA: Diagnosis not present

## 2022-03-29 DIAGNOSIS — R911 Solitary pulmonary nodule: Secondary | ICD-10-CM | POA: Insufficient documentation

## 2022-03-29 DIAGNOSIS — J439 Emphysema, unspecified: Secondary | ICD-10-CM | POA: Diagnosis not present

## 2022-03-29 DIAGNOSIS — I2510 Atherosclerotic heart disease of native coronary artery without angina pectoris: Secondary | ICD-10-CM | POA: Insufficient documentation

## 2022-03-29 DIAGNOSIS — I700 Atherosclerosis of aorta: Secondary | ICD-10-CM | POA: Insufficient documentation

## 2022-03-29 DIAGNOSIS — J432 Centrilobular emphysema: Secondary | ICD-10-CM | POA: Diagnosis not present

## 2022-03-29 DIAGNOSIS — J8410 Pulmonary fibrosis, unspecified: Secondary | ICD-10-CM | POA: Diagnosis not present

## 2022-03-29 LAB — GLUCOSE, CAPILLARY: Glucose-Capillary: 93 mg/dL (ref 70–99)

## 2022-03-29 MED ORDER — FLUDEOXYGLUCOSE F - 18 (FDG) INJECTION
6.4600 | Freq: Once | INTRAVENOUS | Status: AC | PRN
  Administered 2022-03-29: 6.46 via INTRAVENOUS

## 2022-04-01 ENCOUNTER — Ambulatory Visit: Payer: Medicare HMO | Admitting: Physician Assistant

## 2022-04-05 ENCOUNTER — Encounter: Payer: Self-pay | Admitting: Internal Medicine

## 2022-04-05 ENCOUNTER — Ambulatory Visit: Payer: Medicare HMO | Admitting: Internal Medicine

## 2022-04-05 VITALS — BP 148/79 | HR 88 | Temp 98.3°F | Resp 16 | Ht 60.0 in | Wt 117.0 lb

## 2022-04-05 DIAGNOSIS — J984 Other disorders of lung: Secondary | ICD-10-CM

## 2022-04-05 DIAGNOSIS — R918 Other nonspecific abnormal finding of lung field: Secondary | ICD-10-CM

## 2022-04-05 DIAGNOSIS — J432 Centrilobular emphysema: Secondary | ICD-10-CM | POA: Diagnosis not present

## 2022-04-05 DIAGNOSIS — J9611 Chronic respiratory failure with hypoxia: Secondary | ICD-10-CM

## 2022-04-05 DIAGNOSIS — F17219 Nicotine dependence, cigarettes, with unspecified nicotine-induced disorders: Secondary | ICD-10-CM

## 2022-04-05 NOTE — Progress Notes (Signed)
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 Needs tissue biopsy. Her last FEV1 was 0.88L so would llikely not be a candidate for resection. I will schedule a follow up PFT to be done. She unfortunately is still smoking and this was discussed with her again. She is also on oxygen  ROS  General: (-) fever, (-) chills, (-) night sweats, (-) weakness Skin: (-) rashes, (-) itching,. Eyes: (-) visual changes, (-) redness, (-) itching. Nose and Sinuses: (-) nasal stuffiness or itchiness, (-) postnasal drip, (-) nosebleeds, (-) sinus trouble. Mouth and Throat: (-) sore throat, (-) hoarseness. Neck: (-) swollen glands, (-) enlarged thyroid, (-) neck pain. Respiratory: + cough, (-) bloody sputum, + shortness of breath, - wheezing. Cardiovascular: - ankle swelling, (-) chest pain. Lymphatic: (-) lymph node enlargement. Neurologic: (-) numbness, (-) tingling. Psychiatric: (-) anxiety, (-) depression   Current Medication: Outpatient Encounter Medications as of 04/05/2022  Medication Sig   acetaminophen (TYLENOL) 500 MG tablet Take 500 mg by mouth every 4 (four) hours as needed.   albuterol (PROVENTIL) (2.5 MG/3ML) 0.083% nebulizer solution Take 3 mLs (2.5 mg total) by nebulization every 4 (four) hours as needed for wheezing or shortness of breath.   APPLE CIDER VINEGAR PO Take by mouth.   aspirin EC 81 MG tablet Take 81 mg by mouth daily. Swallow whole.   atorvastatin (LIPITOR) 10 MG tablet Take 1 tablet (10 mg total) by mouth daily.   Budeson-Glycopyrrol-Formoterol (BREZTRI AEROSPHERE) 160-9-4.8 MCG/ACT AERO Inhale 2 puffs into the lungs 2 (two) times daily.   ibandronate (BONIVA) 150 MG tablet  Take 1 tablet (150 mg total) by mouth every 30 (thirty) days. Take in the morning with a full glass of water, on an empty stomach, and do not take anything else by mouth or lie down for the next 30 min.   Magnesium 250 MG TABS Take 250 mg by mouth.   nystatin (MYCOSTATIN) 100000 UNIT/ML suspension Take 5 mLs (500,000 Units total) by mouth 4 (four) times daily. Swish and swallow   omeprazole (PRILOSEC) 20 MG capsule Take 20 mg by mouth daily.   Tiotropium Bromide Monohydrate (SPIRIVA RESPIMAT) 2.5 MCG/ACT AERS Inhale 2 puffs into the lungs daily.   No facility-administered encounter medications on file as of 04/05/2022.    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   COPD (chronic obstructive pulmonary disease) (HCC)    Pneumonia    Shortness of breath dyspnea     Family History: Family History  Problem Relation Age of Onset   Breast cancer Mother    COPD Father    Emphysema Father     Social History: Social History   Socioeconomic History   Marital status: Married    Spouse name: Not on file   Number of children: Not on file   Years of education: Not on file   Highest education level: Not on file  Occupational History   Not  on file  Tobacco Use   Smoking status: Every Day    Packs/day: 1.00    Years: 46.00    Total pack years: 46.00    Types: Cigarettes   Smokeless tobacco: Never   Tobacco comments:    2 packs daily  Substance and Sexual Activity   Alcohol use: Yes    Alcohol/week: 7.0 standard drinks of alcohol    Types: 7 Cans of beer per week    Comment: socially   Drug use: No   Sexual activity: Not on file  Other Topics Concern   Not on file  Social History Narrative   Not on file   Social Determinants of Health    Financial Resource Strain: Not on file  Food Insecurity: Not on file  Transportation Needs: Not on file  Physical Activity: Not on file  Stress: Not on file  Social Connections: Not on file  Intimate Partner Violence: Not on file    Vital Signs: Blood pressure (!) 148/79, pulse 88, temperature 98.3 F (36.8 C), resp. rate 16, height 5' (1.524 m), weight 117 lb (53.1 kg), SpO2 99 %.  Examination: General Appearance: The patient is well-developed, well-nourished, and in no distress. Skin: Gross inspection of skin unremarkable. Head: normocephalic, no gross deformities. Eyes: no gross deformities noted. ENT: ears appear grossly normal no exudates. Neck: Supple. No thyromegaly. No LAD. Respiratory: few rhonchi noted. Cardiovascular: Normal S1 and S2 without murmur or rub. Extremities: No cyanosis. pulses are equal. Neurologic: Alert and oriented. No involuntary movements.  LABS: Recent Results (from the past 2160 hour(s))  Surgical pathology     Status: None   Collection Time: 01/29/22  8:28 AM  Result Value Ref Range   SURGICAL PATHOLOGY      SURGICAL PATHOLOGY CASE: [id number] PATIENT: [facility] Surgical Pathology Report     Specimen Submitted: A. Breast, right UOQ  Clinical History: Asymmetry.  R/O [place], PASH.  Abnormal mammogram of right breast R92.8 - primary. Coil-shaped clip placed following stereotactic biopsy of RIGHT breast, upper outer quadrant.    DIAGNOSIS: A. BREAST, RIGHT UPPER OUTER QUADRANT; STEREOTACTIC CORE NEEDLE BIOPSY: - BENIGN MAMMARY PARENCHYMA WITH FIBROCYSTIC AND APOCRINE CHANGES, AND FOCAL USUAL DUCTAL HYPERPLASIA. - FOCAL MICROCALCIFICATIONS ASSOCIATED WITH BENIGN MAMMARY ELEMENTS. - NEGATIVE FOR ATYPICAL PROLIFERATIVE BREAST DISEASE.  GROSS DESCRIPTION: A. Labeled: Right breast stereo asymmetry upper outer quadrant anterior Received: in a formalin-filled [place] collection device Specimen radiograph image(s) available for  review Time/Date in fixative: Collected at 8:28 AM on 01/29/2022 and placed in formalin at 8:30 AM on 01/29/2022 Cold ischemic time: Less than  5 minutes Total fixation time: Approximately 11.75 hours Core pieces: Multiple Measurement: Aggregate, 6.5 x 1.4 x 0.3 cm Description / comments: Received are cores and fragments of yellow focally hemorrhagic fibrofatty tissue.  A diagram is not received on the container or with the requisition. Inked: Blue Entirely submitted in cassette(s):  1 - sections A and B 2 - sections C and D 3 - sections E and F 4 - sections I and J with remaining free-floating fragments  RB 01/29/2022  Final Diagnosis performed by Allena Napoleon, MD.   Electronically signed 02/01/2022 9:06:10AM The electronic signature indicates that the named Attending Pathologist has evaluated the specimen Technical component performed at [place], [address] [place], [place] [address] Lab: [phone] Dir: Rush Farmer, MD, MMM  Professional component performed at [facility] [place], [facility], [place], [place], [place] [address] Lab: [phone] Dir: Kathi Simpers, MD  Glucose, capillary     Status: None   Collection Time: 03/29/22  1:28 PM  Result Value Ref Range   Glucose-Capillary 93 70 - 99 mg/dL    Comment: Glucose reference range applies only to samples taken after fasting for at least 8 hours.    Radiology: NM PET Image Initial (PI) Skull Base To Thigh (F-18 FDG)  Result Date: 03/30/2022 CLINICAL DATA:  Initial treatment strategy for right upper lobe progressive lesion on chest CT. EXAM: NUCLEAR MEDICINE PET SKULL BASE TO THIGH TECHNIQUE: 6.5 mCi F-18 FDG was injected intravenously. Full-ring PET imaging was performed from the skull base to thigh after the radiotracer. CT data was obtained and used for attenuation correction and anatomic localization. Fasting blood glucose: 93 mg/dl COMPARISON:  Chest CT 03/12/2022 FINDINGS: Mediastinal blood pool  activity: SUV max 2.5 Liver activity: SUV max NA NECK: No areas of abnormal hypermetabolism. Incidental CT findings: No cervical adenopathy. CHEST: No thoracic nodal hypermetabolism. The progressive right upper lobe lesion is hypermetabolic. 2.1 x 1.3 cm and a S.U.V. max of 7.4 on 63/2. Incidental CT findings: Deferred to recent diagnostic CT. Aortic atherosclerosis. Moderate centrilobular emphysema. The left apical pleuroparenchymal scarring is not significantly hypermetabolic. Left lower lobe calcified granuloma. ABDOMEN/PELVIS: No abdominopelvic parenchymal or nodal hypermetabolism. Incidental CT findings: Normal adrenal glands. Abdominal aortic at the sclerosis. Probable hysterectomy. Pelvic floor laxity. SKELETON: No abnormal marrow activity. Incidental CT findings: Mild osteopenia. IMPRESSION: 1. Hypermetabolic right upper lobe pulmonary nodule, consistent with primary bronchogenic carcinoma. No evidence of thoracic nodal or extrathoracic hypermetabolic metastasis. T1cN0M0 or stage IA 2. Incidental findings, including: Aortic atherosclerosis (ICD10-I70.0), coronary artery atherosclerosis and emphysema (ICD10-J43.9). Electronically Signed   By: Abigail Miyamoto M.D.   On: 03/30/2022 11:30    No results found.  NM PET Image Initial (PI) Skull Base To Thigh (F-18 FDG)  Result Date: 03/30/2022 CLINICAL DATA:  Initial treatment strategy for right upper lobe progressive lesion on chest CT. EXAM: NUCLEAR MEDICINE PET SKULL BASE TO THIGH TECHNIQUE: 6.5 mCi F-18 FDG was injected intravenously. Full-ring PET imaging was performed from the skull base to thigh after the radiotracer. CT data was obtained and used for attenuation correction and anatomic localization. Fasting blood glucose: 93 mg/dl COMPARISON:  Chest CT 03/12/2022 FINDINGS: Mediastinal blood pool activity: SUV max 2.5 Liver activity: SUV max NA NECK: No areas of abnormal hypermetabolism. Incidental CT findings: No cervical adenopathy. CHEST: No thoracic  nodal hypermetabolism. The progressive right upper lobe lesion is hypermetabolic. 2.1 x 1.3 cm and a S.U.V. max of 7.4 on 63/2. Incidental CT findings: Deferred to recent diagnostic CT. Aortic atherosclerosis. Moderate centrilobular emphysema. The left apical pleuroparenchymal scarring is not significantly hypermetabolic. Left lower lobe calcified granuloma. ABDOMEN/PELVIS: No abdominopelvic parenchymal or nodal hypermetabolism. Incidental CT findings: Normal adrenal glands. Abdominal aortic at the sclerosis. Probable hysterectomy. Pelvic floor laxity. SKELETON: No abnormal marrow activity. Incidental CT findings: Mild osteopenia. IMPRESSION: 1. Hypermetabolic right upper lobe pulmonary nodule, consistent with primary bronchogenic carcinoma. No evidence of thoracic nodal or extrathoracic hypermetabolic metastasis. T1cN0M0 or stage IA 2. Incidental findings, including: Aortic atherosclerosis (ICD10-I70.0), coronary artery atherosclerosis and emphysema (ICD10-J43.9). Electronically Signed   By: Abigail Miyamoto M.D.   On: 03/30/2022 11:30   CT Chest Wo Contrast  Result Date: 03/14/2022 CLINICAL DATA:  Shortness of breath.  History of lung nodule. EXAM: CT CHEST WITHOUT CONTRAST TECHNIQUE: Multidetector CT imaging of the chest was performed following the standard protocol without IV contrast. RADIATION DOSE REDUCTION: This exam was performed  according to the departmental dose-optimization program which includes automated exposure control, adjustment of the mA and/or kV according to patient size and/or use of iterative reconstruction technique. COMPARISON:  Chest CTA 11/10/2021 FINDINGS: Cardiovascular: The heart size is normal. No substantial pericardial effusion. Mild atherosclerotic calcification is noted in the wall of the thoracic aorta. Mediastinum/Nodes: No mediastinal lymphadenopathy. No evidence for gross hilar lymphadenopathy although assessment is limited by the lack of intravenous contrast on the current  study. The esophagus has normal imaging features. There is no axillary lymphadenopathy. Lungs/Pleura: Pleuroparenchymal scarring in the left lung apex is stable. Centrilobular emphsyema noted. Interval progression of a bandlike 2.7 x 0.7 cm right upper lobe pulmonary lesion (image 36/3) posterior left lower lobe calcified granuloma is stable. No pleural effusion. Upper Abdomen: Unremarkable. Musculoskeletal: No worrisome lytic or sclerotic osseous abnormality. IMPRESSION: 1. Fairly marked interval progression of a bandlike 2.7 x 0.7 cm right upper lobe pulmonary lesion. Neoplasm is a concern. PET-CT recommended to further evaluate. 2. Stable left apical pleuroparenchymal scarring. 3.  Emphysema (ICD10-J43.9) and Aortic Atherosclerosis (ICD10-170.0) These results will be called to the ordering clinician or representative by the Radiologist Assistant, and communication documented in the PACS or [employer]. Electronically Signed   By: Misty Stanley M.D.   On: 03/14/2022 07:33      Assessment and Plan: Patient Active Problem List   Diagnosis Date Noted   COPD (chronic obstructive pulmonary disease) ([place]) 07/14/2017   Pneumonia due to Pseudomonas ([place]) 08/25/2014   Sepsis ([place]) 08/24/2014    1. Pulmonary lesion Results of the CT reviewed at length with her.  The patient basically has progression of areas upper lobe lesion and neoplasm is of concern based on the PET scan as follows.  Recommended getting a CT-guided needle biopsy on her for further evaluation and tissue diagnosis.  Reviewed the risks and benefits with her - CT GUIDED NEEDLE PLACEMENT; Future  2. Centrilobular emphysema ([place]) Severe disease will continue to monitor continue with inhaler regimen as suggested.  3. Chronic respiratory failure with hypoxia (HCC) Oxygen therapy for chronic hypoxic respiratory failure  4. Cigarette nicotine dependence with nicotine-induced disorder Smoking cessation counseling unfortunately has not  been of much success.  General Counseling: I have discussed the findings of the evaluation and examination with [name].  I have also discussed any further diagnostic evaluation thatmay be needed or ordered today. Kendrick verbalizes understanding of the findings of todays visit. We also reviewed her medications today and discussed drug interactions and side effects including but not limited excessive drowsiness and altered mental states. We also discussed that there is always a risk not just to her but also people around her. she has been encouraged to call the office with any questions or concerns that should arise related to todays visit.  Orders Placed This Encounter  Procedures   CT GUIDED NEEDLE PLACEMENT    Standing Status:   Future    Standing Expiration Date:   04/06/2023    Order Specific Question:   If indicated for the ordered procedure, I authorize the administration of contrast media per Radiology protocol    Answer:   Yes    Order Specific Question:   Reason for Exam (SYMPTOM  OR DIAGNOSIS REQUIRED)    Answer:   lung mass    Order Specific Question:   Preferred imaging location?    Answer:   [facility]   CT LUNG MASS BIOPSY    Standing Status:   Future    Standing  Expiration Date:   04/07/2023    Order Specific Question:   Lab orders requested (DO NOT place separate lab orders, these will be automatically ordered during procedure specimen collection):    Answer:   Cytology - Non Pap    Order Specific Question:   Lab orders requested (DO NOT place separate lab orders, these will be automatically ordered during procedure specimen collection):    Answer:   Surgical Pathology    Order Specific Question:   Reason for Exam (SYMPTOM  OR DIAGNOSIS REQUIRED)    Answer:   lung mass    Order Specific Question:   Preferred location?    Answer:   [facility]     Time spent: 56  I have personally obtained a history, examined the patient, evaluated laboratory and imaging  results, formulated the assessment and plan and placed orders.    Allyne Gee, MD [facility] Pulmonary and Critical Care Sleep medicine

## 2022-04-09 ENCOUNTER — Other Ambulatory Visit: Payer: Self-pay | Admitting: Nurse Practitioner

## 2022-04-09 MED ORDER — LEVOFLOXACIN 500 MG PO TABS: 500.0000 mg | ORAL_TABLET | Freq: Every day | ORAL | 0 refills | Status: AC

## 2022-04-12 ENCOUNTER — Other Ambulatory Visit: Payer: Self-pay

## 2022-04-12 ENCOUNTER — Emergency Department
Admission: EM | Admit: 2022-04-12 | Discharge: 2022-04-12 | Discharge status: Against Medical Advice | Payer: Medicare HMO | Attending: Emergency Medicine | Admitting: Emergency Medicine

## 2022-04-12 ENCOUNTER — Emergency Department: Payer: Medicare HMO

## 2022-04-12 ENCOUNTER — Telehealth: Payer: Self-pay

## 2022-04-12 DIAGNOSIS — J449 Chronic obstructive pulmonary disease, unspecified: Secondary | ICD-10-CM | POA: Diagnosis not present

## 2022-04-12 DIAGNOSIS — Z5321 Procedure and treatment not carried out due to patient leaving prior to being seen by health care provider: Secondary | ICD-10-CM | POA: Diagnosis not present

## 2022-04-12 DIAGNOSIS — Z1152 Encounter for screening for COVID-19: Secondary | ICD-10-CM | POA: Insufficient documentation

## 2022-04-12 DIAGNOSIS — R531 Weakness: Secondary | ICD-10-CM | POA: Insufficient documentation

## 2022-04-12 DIAGNOSIS — R0602 Shortness of breath: Secondary | ICD-10-CM | POA: Insufficient documentation

## 2022-04-12 LAB — CBC WITH DIFFERENTIAL/PLATELET
Abs Immature Granulocytes: 0.06 10*3/uL (ref 0.00–0.07)
Basophils Absolute: 0 10*3/uL (ref 0.0–0.1)
Basophils Relative: 0 %
Eosinophils Absolute: 0 10*3/uL (ref 0.0–0.5)
Eosinophils Relative: 0 %
HCT: 32.3 % — ABNORMAL LOW (ref 36.0–46.0)
Hemoglobin: 11.1 g/dL — ABNORMAL LOW (ref 12.0–15.0)
Immature Granulocytes: 1 %
Lymphocytes Relative: 8 %
Lymphs Abs: 0.5 10*3/uL — ABNORMAL LOW (ref 0.7–4.0)
MCH: 28.9 pg (ref 26.0–34.0)
MCHC: 34.4 g/dL (ref 30.0–36.0)
MCV: 84.1 fL (ref 80.0–100.0)
Monocytes Absolute: 1.2 10*3/uL — ABNORMAL HIGH (ref 0.1–1.0)
Monocytes Relative: 21 %
Neutro Abs: 4 10*3/uL (ref 1.7–7.7)
Neutrophils Relative %: 70 %
Platelets: 232 10*3/uL (ref 150–400)
RBC: 3.84 MIL/uL — ABNORMAL LOW (ref 3.87–5.11)
RDW: 12.5 % (ref 11.5–15.5)
WBC: 5.7 10*3/uL (ref 4.0–10.5)
nRBC: 0 % (ref 0.0–0.2)

## 2022-04-12 LAB — COMPREHENSIVE METABOLIC PANEL
ALT: 22 U/L (ref 0–44)
AST: 47 U/L — ABNORMAL HIGH (ref 15–41)
Albumin: 3.2 g/dL — ABNORMAL LOW (ref 3.5–5.0)
Alkaline Phosphatase: 53 U/L (ref 38–126)
Anion gap: 9 (ref 5–15)
BUN: 11 mg/dL (ref 8–23)
CO2: 27 mmol/L (ref 22–32)
Calcium: 7.9 mg/dL — ABNORMAL LOW (ref 8.9–10.3)
Chloride: 80 mmol/L — ABNORMAL LOW (ref 98–111)
Creatinine, Ser: 0.72 mg/dL (ref 0.44–1.00)
GFR, Estimated: >60 mL/min (ref >60)
Glucose, Bld: 114 mg/dL — ABNORMAL HIGH (ref 70–99)
Potassium: 3.2 mmol/L — ABNORMAL LOW (ref 3.5–5.1)
Sodium: 116 mmol/L — CL (ref 135–145)
Total Bilirubin: 0.5 mg/dL (ref 0.3–1.2)
Total Protein: 6.5 g/dL (ref 6.5–8.1)

## 2022-04-12 LAB — RESP PANEL BY RT-PCR (RSV, FLU A&B, COVID)  RVPGX2
Influenza A by PCR: NEGATIVE
Influenza B by PCR: NEGATIVE
Resp Syncytial Virus by PCR: NEGATIVE
SARS Coronavirus 2 by RT PCR: NEGATIVE

## 2022-04-12 LAB — TROPONIN I (HIGH SENSITIVITY): Troponin I (High Sensitivity): 23 ng/L — ABNORMAL HIGH (ref <18)

## 2022-04-12 MED ORDER — PREDNISONE 10 MG PO TABS: ORAL_TABLET | ORAL | 0 refills | Status: AC

## 2022-04-12 MED ORDER — ONDANSETRON HCL 4 MG PO TABS: 4.0000 mg | ORAL_TABLET | Freq: Three times a day (TID) | ORAL | 0 refills | Status: AC | PRN

## 2022-04-12 NOTE — ED Triage Notes (Signed)
To triage via wheelchair with c/o shortness of breath and weakness. Spoke with provider via telehealth and told " it sounds like the flu." No testing done, prescribed meds x 2 days ago, started taking, but no relief.  Hx of COPD. Pt currently talking in complete sentences, NAD noted.

## 2022-04-12 NOTE — Telephone Encounter (Signed)
Pt advised that As per Kelly Curry send prednisone and zofran

## 2022-04-12 NOTE — ED Notes (Signed)
Blue top sent to lab with save label at this time

## 2022-04-13 ENCOUNTER — Telehealth: Payer: Self-pay | Admitting: Emergency Medicine

## 2022-04-13 NOTE — Telephone Encounter (Signed)
Called patient due to left emergency department before provider exam to inquire about condition and follow up plans. I explained that she has critical labs--sodium and chloride and that I would recommend she return here.  Says she is not coming back here.  I asked her to call her pcp and have them review.  She is still feeling weak and I explained tha thtis may be due to her electrolytes.  She agrees tocall pcpc.

## 2022-04-14 ENCOUNTER — Other Ambulatory Visit: Payer: Self-pay

## 2022-04-14 ENCOUNTER — Observation Stay: Payer: Medicare HMO

## 2022-04-14 ENCOUNTER — Telehealth: Payer: Self-pay | Admitting: Nurse Practitioner

## 2022-04-14 ENCOUNTER — Telehealth: Payer: Self-pay

## 2022-04-14 ENCOUNTER — Observation Stay
Admission: EM | Admit: 2022-04-14 | Discharge: 2022-04-15 | Disposition: A | Discharge status: Home / Self Care | Payer: Medicare HMO | Attending: Internal Medicine | Admitting: Internal Medicine

## 2022-04-14 ENCOUNTER — Encounter: Payer: Self-pay | Admitting: Internal Medicine

## 2022-04-14 DIAGNOSIS — C3411 Malignant neoplasm of upper lobe, right bronchus or lung: Secondary | ICD-10-CM | POA: Insufficient documentation

## 2022-04-14 DIAGNOSIS — R0602 Shortness of breath: Secondary | ICD-10-CM | POA: Diagnosis not present

## 2022-04-14 DIAGNOSIS — R531 Weakness: Secondary | ICD-10-CM | POA: Diagnosis present

## 2022-04-14 DIAGNOSIS — F17210 Nicotine dependence, cigarettes, uncomplicated: Secondary | ICD-10-CM | POA: Insufficient documentation

## 2022-04-14 DIAGNOSIS — C3490 Malignant neoplasm of unspecified part of unspecified bronchus or lung: Secondary | ICD-10-CM

## 2022-04-14 DIAGNOSIS — E876 Hypokalemia: Secondary | ICD-10-CM | POA: Insufficient documentation

## 2022-04-14 DIAGNOSIS — E860 Dehydration: Secondary | ICD-10-CM | POA: Diagnosis not present

## 2022-04-14 DIAGNOSIS — J449 Chronic obstructive pulmonary disease, unspecified: Secondary | ICD-10-CM | POA: Diagnosis present

## 2022-04-14 DIAGNOSIS — Z79899 Other long term (current) drug therapy: Secondary | ICD-10-CM | POA: Diagnosis not present

## 2022-04-14 DIAGNOSIS — R911 Solitary pulmonary nodule: Secondary | ICD-10-CM | POA: Diagnosis not present

## 2022-04-14 DIAGNOSIS — J441 Chronic obstructive pulmonary disease with (acute) exacerbation: Secondary | ICD-10-CM | POA: Diagnosis not present

## 2022-04-14 DIAGNOSIS — E871 Hypo-osmolality and hyponatremia: Secondary | ICD-10-CM | POA: Diagnosis not present

## 2022-04-14 DIAGNOSIS — Z1152 Encounter for screening for COVID-19: Secondary | ICD-10-CM | POA: Insufficient documentation

## 2022-04-14 DIAGNOSIS — J439 Emphysema, unspecified: Secondary | ICD-10-CM | POA: Diagnosis not present

## 2022-04-14 LAB — BASIC METABOLIC PANEL
Anion gap: 13 (ref 5–15)
Anion gap: 12 (ref 5–15)
BUN: 16 mg/dL (ref 8–23)
BUN: 14 mg/dL (ref 8–23)
CO2: 26 mmol/L (ref 22–32)
CO2: 25 mmol/L (ref 22–32)
Calcium: 8.5 mg/dL — ABNORMAL LOW (ref 8.9–10.3)
Calcium: 8.1 mg/dL — ABNORMAL LOW (ref 8.9–10.3)
Chloride: 86 mmol/L — ABNORMAL LOW (ref 98–111)
Chloride: 92 mmol/L — ABNORMAL LOW (ref 98–111)
Creatinine, Ser: 0.86 mg/dL (ref 0.44–1.00)
Creatinine, Ser: 0.8 mg/dL (ref 0.44–1.00)
GFR, Estimated: >60 mL/min (ref >60)
GFR, Estimated: >60 mL/min (ref >60)
Glucose, Bld: 120 mg/dL — ABNORMAL HIGH (ref 70–99)
Glucose, Bld: 121 mg/dL — ABNORMAL HIGH (ref 70–99)
Potassium: 3.3 mmol/L — ABNORMAL LOW (ref 3.5–5.1)
Potassium: 3.8 mmol/L (ref 3.5–5.1)
Sodium: 125 mmol/L — ABNORMAL LOW (ref 135–145)
Sodium: 129 mmol/L — ABNORMAL LOW (ref 135–145)

## 2022-04-14 LAB — CBC
HCT: 34 % — ABNORMAL LOW (ref 36.0–46.0)
Hemoglobin: 11.8 g/dL — ABNORMAL LOW (ref 12.0–15.0)
MCH: 29.3 pg (ref 26.0–34.0)
MCHC: 34.7 g/dL (ref 30.0–36.0)
MCV: 84.4 fL (ref 80.0–100.0)
Platelets: 332 10*3/uL (ref 150–400)
RBC: 4.03 MIL/uL (ref 3.87–5.11)
RDW: 12.7 % (ref 11.5–15.5)
WBC: 8.3 10*3/uL (ref 4.0–10.5)
nRBC: 0 % (ref 0.0–0.2)

## 2022-04-14 LAB — URINALYSIS, ROUTINE W REFLEX MICROSCOPIC
Bilirubin Urine: NEGATIVE
Glucose, UA: NEGATIVE mg/dL
Hgb urine dipstick: NEGATIVE
Ketones, ur: NEGATIVE mg/dL
Leukocytes,Ua: NEGATIVE
Nitrite: NEGATIVE
Protein, ur: NEGATIVE mg/dL
Specific Gravity, Urine: 1.013 (ref 1.005–1.030)
pH: 6 (ref 5.0–8.0)

## 2022-04-14 LAB — TSH: TSH: 1.998 u[IU]/mL (ref 0.350–4.500)

## 2022-04-14 LAB — SODIUM, URINE, RANDOM: Sodium, Ur: 19 mmol/L

## 2022-04-14 LAB — OSMOLALITY, URINE: Osmolality, Ur: 380 mOsm/kg (ref 300–900)

## 2022-04-14 LAB — RESP PANEL BY RT-PCR (RSV, FLU A&B, COVID)  RVPGX2
Influenza A by PCR: NEGATIVE
Influenza B by PCR: NEGATIVE
Resp Syncytial Virus by PCR: NEGATIVE
SARS Coronavirus 2 by RT PCR: NEGATIVE

## 2022-04-14 LAB — CREATININE, URINE, RANDOM: Creatinine, Urine: 118 mg/dL

## 2022-04-14 LAB — OSMOLALITY: Osmolality: 264 mOsm/kg — ABNORMAL LOW (ref 275–295)

## 2022-04-14 LAB — MAGNESIUM: Magnesium: 2.2 mg/dL (ref 1.7–2.4)

## 2022-04-14 LAB — HIV ANTIBODY (ROUTINE TESTING W REFLEX): HIV Screen 4th Generation wRfx: NONREACTIVE

## 2022-04-14 MED ORDER — LEVOFLOXACIN 500 MG PO TABS
500.0000 mg | ORAL_TABLET | Freq: Every day | ORAL | Status: DC
  Filled 2022-04-14: qty 1

## 2022-04-14 MED ORDER — ACETAMINOPHEN 325 MG PO TABS
650.0000 mg | ORAL_TABLET | Freq: Four times a day (QID) | ORAL | Status: DC | PRN
  Administered 2022-04-14: 650 mg via ORAL
  Filled 2022-04-14: qty 2

## 2022-04-14 MED ORDER — IPRATROPIUM-ALBUTEROL 0.5-2.5 (3) MG/3ML IN SOLN
3.0000 mL | Freq: Four times a day (QID) | RESPIRATORY_TRACT | Status: DC
  Administered 2022-04-14 (×2): 3 mL via RESPIRATORY_TRACT
  Filled 2022-04-14 (×2): qty 3

## 2022-04-14 MED ORDER — ACETAMINOPHEN 650 MG RE SUPP: 650.0000 mg | Freq: Four times a day (QID) | RECTAL | Status: DC | PRN

## 2022-04-14 MED ORDER — MOMETASONE FURO-FORMOTEROL FUM 200-5 MCG/ACT IN AERO
2.0000 | INHALATION_SPRAY | Freq: Two times a day (BID) | RESPIRATORY_TRACT | Status: DC
  Administered 2022-04-14 – 2022-04-15 (×2): 2 via RESPIRATORY_TRACT
  Filled 2022-04-14: qty 8.8

## 2022-04-14 MED ORDER — SODIUM CHLORIDE 0.9 % IV BOLUS
1000.0000 mL | Freq: Once | INTRAVENOUS | Status: AC
  Administered 2022-04-14: 1000 mL via INTRAVENOUS

## 2022-04-14 MED ORDER — POTASSIUM CHLORIDE CRYS ER 20 MEQ PO TBCR
40.0000 meq | EXTENDED_RELEASE_TABLET | Freq: Once | ORAL | Status: AC
  Administered 2022-04-14: 40 meq via ORAL
  Filled 2022-04-14: qty 2

## 2022-04-14 MED ORDER — ONDANSETRON HCL 4 MG PO TABS: 4.0000 mg | ORAL_TABLET | Freq: Four times a day (QID) | ORAL | Status: DC | PRN

## 2022-04-14 MED ORDER — BUDESON-GLYCOPYRROL-FORMOTEROL 160-9-4.8 MCG/ACT IN AERO: 2.0000 | INHALATION_SPRAY | Freq: Two times a day (BID) | RESPIRATORY_TRACT | Status: DC

## 2022-04-14 MED ORDER — PREDNISONE 20 MG PO TABS
40.0000 mg | ORAL_TABLET | Freq: Every day | ORAL | Status: AC
  Administered 2022-04-14: 40 mg via ORAL
  Administered 2022-04-15: 20 mg via ORAL
  Filled 2022-04-14 (×2): qty 2

## 2022-04-14 MED ORDER — ADULT MULTIVITAMIN W/MINERALS CH
1.0000 | ORAL_TABLET | Freq: Every day | ORAL | Status: DC
  Administered 2022-04-14 – 2022-04-15 (×2): 1 via ORAL
  Filled 2022-04-14 (×2): qty 1

## 2022-04-14 MED ORDER — ENOXAPARIN SODIUM 40 MG/0.4ML IJ SOSY
40.0000 mg | PREFILLED_SYRINGE | INTRAMUSCULAR | Status: DC
  Administered 2022-04-14 – 2022-04-15 (×2): 40 mg via SUBCUTANEOUS
  Filled 2022-04-14 (×2): qty 0.4

## 2022-04-14 MED ORDER — POLYETHYLENE GLYCOL 3350 17 G PO PACK: 17.0000 g | PACK | Freq: Every day | ORAL | Status: DC | PRN

## 2022-04-14 MED ORDER — ONDANSETRON HCL 4 MG/2ML IJ SOLN: 4.0000 mg | Freq: Four times a day (QID) | INTRAMUSCULAR | Status: DC | PRN

## 2022-04-14 MED ORDER — ATORVASTATIN CALCIUM 10 MG PO TABS
10.0000 mg | ORAL_TABLET | Freq: Every day | ORAL | Status: DC
  Administered 2022-04-15: 10 mg via ORAL
  Filled 2022-04-14: qty 1

## 2022-04-14 MED ORDER — UMECLIDINIUM BROMIDE 62.5 MCG/ACT IN AEPB
1.0000 | INHALATION_SPRAY | Freq: Every day | RESPIRATORY_TRACT | Status: DC
  Administered 2022-04-14 – 2022-04-15 (×2): 1 via RESPIRATORY_TRACT
  Filled 2022-04-14: qty 7

## 2022-04-14 NOTE — Assessment & Plan Note (Signed)
Patient presenting with hyponatremia of undetermined etiology.  On 12/18, sodium was 116, with improvement up to 125 today with no medical interventions.  Differential includes acute illness leading to mild dehydration versus SIADH in the setting of suspected underlying lung cancer of unknown etiology.  She has received 1 L fluids in the ED so far, will wait for further lab results and repeat BMP prior to administering additional fluids.   - Urine sodium and creatinine pending - Serum osmolality pending - TSH pending - Repeat BMP this afternoon

## 2022-04-14 NOTE — ED Provider Notes (Signed)
[facility] Provider Note    Event Date/Time   First MD Initiated Contact with Patient 04/14/22 1047     (approximate)   History   Abnormal Lab and Weakness   HPI  Kelly Curry is a 66 y.o. female who presents to the emergency department for lab abnormality.  Patient states that she was evaluated 2 days ago and at that time had lab work done but ultimately left prior to being evaluated by a provider.  Was called back and told that she had a significantly low sodium level and to return to the emergency department for treatment.  Patient opted to follow-up with her outpatient primary care physician at that time.  Repeat outpatient labs done and found to have a sodium level of 116 and told to come to the emergency department.  Endorses improvement of her nausea and vomiting but does state that she feels very dehydrated.  No diarrhea.  No abdominal pain.  Denies any cough or shortness of breath.     Physical Exam   Triage Vital Signs: ED Triage Vitals  Enc Vitals Group     BP 04/14/22 1033 118/85     Pulse Rate 04/14/22 1029 (!) 107     Resp 04/14/22 1029 18     Temp --      Temp src --      SpO2 04/14/22 1029 93 %     Weight --      Height --      Head Circumference --      Peak Flow --      Pain Score 04/14/22 1032 0     Pain Loc --      Pain Edu? --      Excl. in [place]? --     Most recent vital signs: Vitals:   04/14/22 1132 04/14/22 1200  BP: (!) 153/81 (!) 140/112  Pulse: 96 99  Resp: (!) 22 (!) 23  SpO2: 94% 98%    Physical Exam Constitutional:      Appearance: She is well-developed.  HENT:     Head: Atraumatic.     Comments: Dry mucous membranes Eyes:     Conjunctiva/sclera: Conjunctivae normal.  Cardiovascular:     Rate and Rhythm: Regular rhythm.  Pulmonary:     Effort: No respiratory distress.  Abdominal:     General: There is no distension.  Musculoskeletal:        General: Normal range of motion.     Cervical back:  Normal range of motion.  Skin:    General: Skin is warm.  Neurological:     Mental Status: She is alert. Mental status is at baseline.     IMPRESSION / MDM / ASSESSMENT AND PLAN / ED COURSE  I reviewed the triage vital signs and the nursing notes.  Differential diagnosis including dehydration, volume depletion, electrolyte abnormality, COVID/influenza  EKG   No tachycardic or bradycardic dysrhythmias while on cardiac telemetry.  On chart review of outside records patient had a negative COVID test but did not have influenza or RSV testing.  Added on COVID and influenza testing.  Patient's sodium as an outpatient was 116.  LABS (all labs ordered are listed, but only abnormal results are displayed) Labs interpreted as -    Labs Reviewed  BASIC METABOLIC PANEL - Abnormal; Notable for the following components:      Result Value   Sodium 125 (*)    Potassium 3.3 (*)  Chloride 86 (*)    Glucose, Bld 120 (*)    Calcium 8.5 (*)    All other components within normal limits  CBC - Abnormal; Notable for the following components:   Hemoglobin 11.8 (*)    HCT 34.0 (*)    All other components within normal limits  URINALYSIS, ROUTINE W REFLEX MICROSCOPIC - Abnormal; Notable for the following components:   Color, Urine YELLOW (*)    APPearance CLEAR (*)    All other components within normal limits  RESP PANEL BY RT-PCR (RSV, FLU A&B, COVID)  [id number]  MAGNESIUM  CBG MONITORING, ED  Hypokalemia.  Add on magnesium level.  Hyponatremia of 125.  TREATMENT    1 L normal saline, IV potassium and p.o. potassium repletion.  Clinical picture is most consistent with hyponatremia secondary to volume depletion from viral illness.  Consulted hospitalist for admission for hyponatremia  PROCEDURES:  Critical Care performed: Yes  .Critical Care  Performed by: Nathaniel Man, MD Authorized by: Nathaniel Man, MD   Critical care provider statement:    Critical care time (minutes):  30    Critical care time was exclusive of:  Separately billable procedures and treating other patients   Critical care was necessary to treat or prevent imminent or life-threatening deterioration of the following conditions:  Metabolic crisis   Critical care was time spent personally by me on the following activities:  Development of treatment plan with patient or surrogate, discussions with consultants, evaluation of patient's response to treatment, examination of patient, ordering and review of laboratory studies, ordering and review of radiographic studies, ordering and performing treatments and interventions, pulse oximetry, re-evaluation of patient's condition and review of old charts   Patient's presentation is most consistent with acute presentation with potential threat to life or bodily function.   MEDICATIONS ORDERED IN ED: Medications  sodium chloride 0.9 % bolus 1,000 mL (1,000 mLs Intravenous New Bag/Given 04/14/22 1127)  potassium chloride SA (KLOR-CON M) CR tablet 40 mEq (40 mEq Oral Given 04/14/22 1213)    FINAL CLINICAL IMPRESSION(S) / ED DIAGNOSES   Final diagnoses:  Hyponatremia  Hypokalemia  Dehydration     Rx / DC Orders   ED Discharge Orders     None        Note:  This document was prepared using Dragon voice recognition software and may include unintentional dictation errors.   Nathaniel Man, MD 04/14/22 1216

## 2022-04-14 NOTE — Assessment & Plan Note (Signed)
Patient recently underwent PET scan that showed hypermetabolic right upper lobe lesion concerning for primary bronchogenic carcinoma.  She has been referred for consultation for biopsy.  Patient is aware of this and understands.

## 2022-04-14 NOTE — Telephone Encounter (Signed)
Pt called that her hospital call her that she had critical labs sodium and chloride as per alyssa and dr Humphrey Rolls advised pt that she need go to ED ASAP she said we will make arrangement go to ER

## 2022-04-14 NOTE — ED Triage Notes (Signed)
Pt to ED via POV, pt was here on 12/18 and left before completing care. Pts sodium was 116. Pt was advised that she needed to come back in. Pt states that she is feeling very dry. Pt states that she was sick with GI virus. Pt states that she is still feeling very weak. Pt is in NAD at this time.

## 2022-04-14 NOTE — H&P (Signed)
History and Physical    Patient: Kelly Curry YWV:[id number] DOB: 03-28-1956 DOA: 04/14/2022 DOS: the patient was seen and examined on 04/14/2022 PCP: Jonetta Osgood, NP  Patient coming from: Home  Chief Complaint:  Chief Complaint  Patient presents with   Abnormal Lab   Weakness   HPI: Kelly Curry is a 66 y.o. 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  Kelly Curry states that last week around 12/13, she developed nausea with nonbloody vomiting in addition to increasing shortness of breath. Per chart review, patient contacted her pulmonologist on 12/15 due to symptoms and she was prescribed a 7-day course of Levaquin and a prednisone taper.  Over the next few days, she states that her nausea and vomiting resolved and she was gradually increasing her p.o. intake.  On 12/18, she went to the ED for evaluation for shortness of breath but left prior to being evaluated.  Labs obtained at that time demonstrated a sodium of 116.  Yesterday, she was contacted and recommended to return to the ED immediately due to abnormal sodium.  At this time, she endorses slightly worsening shortness of breath and cough over the last 24 hours.  She endorses some generalized weakness that has been present for the last 7 days but denies any focal weakness, headache, vision changes, dizziness, altered mental status.  ED course: On arrival to the ED, patient was normotensive at 118/85 with heart rate of 96.  She was saturating 93% on room air.  Initial workup remarkable for sodium of 125, potassium of 3.3, chloride of 86, WBC of 8.3, hemoglobin of 11.8.  Due to symptomatic hyponatremia, [facility] contacted for admission.  Review of Systems: As mentioned in the history of present illness. All other systems reviewed and are negative.  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 She has a 46.00 pack-year smoking history. She has never used smokeless tobacco. She reports current alcohol use of about 7.0 standard drinks of alcohol per week. She reports that she does not use drugs.  Allergies  Allergen Reactions   Hydrocodone Diarrhea and Nausea And Vomiting   Codeine Other (See Comments)    Reaction unknown   Ibuprofen Other (See Comments)    Reaction unknown    Family History  Problem Relation Age of Onset   Breast cancer Mother    COPD Father    Emphysema Father     Prior to Admission medications   Medication Sig Start Date End Date Taking? Authorizing Provider  acetaminophen (TYLENOL) 500 MG tablet Take 500 mg by mouth every 4 (four) hours as needed.    [provider]  albuterol (PROVENTIL) (2.5 MG/3ML) 0.083% nebulizer solution Take 3 mLs (2.5 mg total) by nebulization every 4 (four) hours as needed for wheezing or shortness of breath. 07/14/17   Allyne Gee, MD  APPLE CIDER VINEGAR PO Take by mouth.    [provider]  aspirin EC 81 MG tablet Take 81 mg by mouth  daily. Swallow whole.    [provider]  atorvastatin (LIPITOR) 10 MG tablet Take 1 tablet (10 mg total) by mouth daily. 10/12/21   Jonetta Osgood, NP  Budeson-Glycopyrrol-Formoterol (BREZTRI AEROSPHERE) 160-9-4.8 MCG/ACT AERO Inhale 2 puffs into the lungs 2 (two) times daily. 11/29/21   Jonetta Osgood, NP  ibandronate (BONIVA) 150 MG tablet Take 1 tablet (150 mg total) by mouth every 30 (thirty) days. Take in the morning with a full glass of water, on an empty stomach, and do not take anything else by mouth or lie down for the next 30 min.  10/12/21   Jonetta Osgood, NP  levofloxacin (LEVAQUIN) 500 MG tablet Take 1 tablet (500 mg total) by mouth daily for 7 days. 04/09/22 04/16/22  Jonetta Osgood, NP  Magnesium 250 MG TABS Take 250 mg by mouth.    [provider]  nystatin (MYCOSTATIN) 100000 UNIT/ML suspension Take 5 mLs (500,000 Units total) by mouth 4 (four) times daily. Swish and swallow 11/11/21   Lavera Guise, MD  omeprazole (PRILOSEC) 20 MG capsule Take 20 mg by mouth daily.    [provider]  ondansetron (ZOFRAN) 4 MG tablet Take 1 tablet (4 mg total) by mouth every 8 (eight) hours as needed for nausea or vomiting. 04/12/22   Jonetta Osgood, NP  predniSONE (DELTASONE) 10 MG tablet Take 1 tab po 3 x day for 3 days then take 1 tab po 2 x a day for 3 days and then take 1 tab po daily for 3 days 04/12/22   Jonetta Osgood, NP  Tiotropium Bromide Monohydrate (SPIRIVA RESPIMAT) 2.5 MCG/ACT AERS Inhale 2 puffs into the lungs daily. 02/09/22   Lavera Guise, MD    Physical Exam: Vitals:   04/14/22 1115 04/14/22 1132 04/14/22 1200 04/14/22 1230  BP:  (!) 153/81 (!) 140/112 (!) 145/110  Pulse: 94 96 99 (!) 112  Resp: (!) 21 (!) 22 (!) 23 (!) 22  SpO2: 96% 94% 98% (!) 86%   Physical Exam Vitals and nursing note reviewed.  Constitutional:      General: She is not in acute distress.    Appearance: She is normal weight. She is not toxic-appearing.  HENT:     Head: Normocephalic and atraumatic.     Mouth/Throat:     Mouth: Mucous membranes are dry.     Pharynx: Oropharynx is clear.  Eyes:     Conjunctiva/sclera: Conjunctivae normal.     Pupils: Pupils are equal, round, and reactive to light.  Cardiovascular:     Rate and Rhythm: Normal rate and regular rhythm.     Heart sounds: No murmur heard.    No gallop.  Pulmonary:     Effort: Pulmonary effort is normal. Tachypnea present. No accessory muscle usage.     Breath sounds: Wheezing (Expiratory wheezing heard throughout) present.  Abdominal:      General: Bowel sounds are normal. There is no distension.     Palpations: Abdomen is soft.     Tenderness: There is no abdominal tenderness.  Musculoskeletal:     Cervical back: Neck supple.     Right lower leg: No edema.     Left lower leg: No edema.  Skin:    General: Skin is warm and dry.  Neurological:     General: No focal deficit present.     Mental Status: She is alert and oriented to person, place, and time. Mental status is at baseline.  Psychiatric:  Mood and Affect: Mood normal.        Behavior: Behavior normal.    Data Reviewed: CBC with WBC of 8.3, hemoglobin of 11.8, MCV of 84, and platelets of 332. Sodium of 125, potassium of 3.3, chloride of 86, glucose of 120, bicarb 26, creatinine 0.86. BMP obtained on 04/12/2022 reviewed.  Sodium of 116, with potassium of 3.2. Magnesium within normal limits at 2.2. Urinalysis with no abnormalities noted.  COVID-19 PCR, influenza PCR and RSV PCR negative on 12/18.  EKG obtained today personally reviewed.  Sinus rhythm with rate of 96.  Borderline first-degree AV block.  Right axis deviation.  There are no new results to review at this time.  Assessment and Plan: * Hyponatremia Patient presenting with hyponatremia of undetermined etiology.  On 12/18, sodium was 116, with improvement up to 125 today with no medical interventions.  Differential includes acute illness leading to mild dehydration versus SIADH in the setting of suspected underlying lung cancer of unknown etiology.  She has received 1 L fluids in the ED so far, will wait for further lab results and repeat BMP prior to administering additional fluids.   - Urine sodium and creatinine pending - Serum osmolality pending - TSH pending - Repeat BMP this afternoon  Lung cancer [facility]) Patient recently underwent PET scan that showed hypermetabolic right upper lobe lesion concerning for primary bronchogenic carcinoma.  She has been referred for consultation for biopsy.   Patient is aware of this and understands.  COPD (chronic obstructive pulmonary disease) ([place]) Patient has a long-term history of COPD that has been gradually worsening.  She has been on Levaquin and a prednisone taper over the last 5 days due to exacerbation.  She continues to be short of breath today.  - Repeat chest x-ray - Continue scheduled DuoNebs every 6 hours - Continue Levaquin to complete 7-day course - Continue prednisone 40 mg daily to complete 7-day course - Continue home bronchodilators  Advance Care Planning:   Code Status: Full Code discussed CODE STATUS with Kelly Curry.  She states she would like full resuscitative efforts in the case of cardiac or pulmonary arrest, however she would not like to be on life support for prolonged period of time.  She has expressed these wishes to her family.  She states her husband would be her healthcare power of attorney.  Consults: None  Family Communication: No family at bedside.  Severity of Illness: The appropriate patient status for this patient is OBSERVATION. Observation status is judged to be reasonable and necessary in order to provide the required intensity of service to ensure the patient's safety. The patient's presenting symptoms, physical exam findings, and initial radiographic and laboratory data in the context of their medical condition is felt to place them at decreased risk for further clinical deterioration. Furthermore, it is anticipated that the patient will be medically stable for discharge from the hospital within 2 midnights of admission.   Author: Jose Persia, MD 04/14/2022 1:22 PM  For on call review [email or web address].

## 2022-04-14 NOTE — Telephone Encounter (Signed)
Spoke with patient on phone, sodium level of 116, at high risk for seizure. Patient made aware, unable to direct admit, patient does not have ride, needs to call 911 and go to ER Message left on her husband's voicemail as well for him to call the clinic.  He is working out of town right now and is not available to drive her to the ER.  With a sodium level this low, she may be severely dehydrated as well.   Consulted with Dr. Clayborn Bigness regarding situation and she agrees with recommendations.

## 2022-04-14 NOTE — Assessment & Plan Note (Signed)
Patient has a long-term history of COPD that has been gradually worsening.  She has been on Levaquin and a prednisone taper over the last 5 days due to exacerbation.  She continues to be short of breath today.  - Repeat chest x-ray - Continue scheduled DuoNebs every 6 hours - Continue Levaquin to complete 7-day course - Continue prednisone 40 mg daily to complete 7-day course - Continue home bronchodilators

## 2022-04-15 DIAGNOSIS — E871 Hypo-osmolality and hyponatremia: Secondary | ICD-10-CM | POA: Diagnosis not present

## 2022-04-15 LAB — BASIC METABOLIC PANEL
Anion gap: 8 (ref 5–15)
Anion gap: 7 (ref 5–15)
BUN: 14 mg/dL (ref 8–23)
BUN: 15 mg/dL (ref 8–23)
CO2: 27 mmol/L (ref 22–32)
CO2: 25 mmol/L (ref 22–32)
Calcium: 8.6 mg/dL — ABNORMAL LOW (ref 8.9–10.3)
Calcium: 8.1 mg/dL — ABNORMAL LOW (ref 8.9–10.3)
Chloride: 96 mmol/L — ABNORMAL LOW (ref 98–111)
Chloride: 98 mmol/L (ref 98–111)
Creatinine, Ser: 0.76 mg/dL (ref 0.44–1.00)
Creatinine, Ser: 0.84 mg/dL (ref 0.44–1.00)
GFR, Estimated: >60 mL/min (ref >60)
GFR, Estimated: >60 mL/min (ref >60)
Glucose, Bld: 100 mg/dL — ABNORMAL HIGH (ref 70–99)
Glucose, Bld: 149 mg/dL — ABNORMAL HIGH (ref 70–99)
Potassium: 4.1 mmol/L (ref 3.5–5.1)
Potassium: 3.7 mmol/L (ref 3.5–5.1)
Sodium: 131 mmol/L — ABNORMAL LOW (ref 135–145)
Sodium: 130 mmol/L — ABNORMAL LOW (ref 135–145)

## 2022-04-15 LAB — CBC
HCT: 34 % — ABNORMAL LOW (ref 36.0–46.0)
Hemoglobin: 11.6 g/dL — ABNORMAL LOW (ref 12.0–15.0)
MCH: 29.1 pg (ref 26.0–34.0)
MCHC: 34.1 g/dL (ref 30.0–36.0)
MCV: 85.2 fL (ref 80.0–100.0)
Platelets: 342 10*3/uL (ref 150–400)
RBC: 3.99 MIL/uL (ref 3.87–5.11)
RDW: 13 % (ref 11.5–15.5)
WBC: 10.8 10*3/uL — ABNORMAL HIGH (ref 4.0–10.5)
nRBC: 0 % (ref 0.0–0.2)

## 2022-04-15 MED ORDER — SODIUM CHLORIDE 1 G PO TABS: 1.0000 g | ORAL_TABLET | Freq: Two times a day (BID) | ORAL | Status: DC

## 2022-04-15 MED ORDER — ALBUTEROL SULFATE (2.5 MG/3ML) 0.083% IN NEBU: 2.5000 mg | INHALATION_SOLUTION | Freq: Four times a day (QID) | RESPIRATORY_TRACT | Status: DC | PRN

## 2022-04-15 MED ORDER — SODIUM CHLORIDE 1 G PO TABS: 1.0000 g | ORAL_TABLET | Freq: Two times a day (BID) | ORAL | 1 refills | Status: AC

## 2022-04-15 MED ORDER — PREDNISONE 20 MG PO TABS: 20.0000 mg | ORAL_TABLET | Freq: Every day | ORAL | Status: DC

## 2022-04-15 MED ORDER — IPRATROPIUM-ALBUTEROL 0.5-2.5 (3) MG/3ML IN SOLN
3.0000 mL | Freq: Two times a day (BID) | RESPIRATORY_TRACT | Status: DC
  Administered 2022-04-15: 3 mL via RESPIRATORY_TRACT
  Filled 2022-04-15: qty 3

## 2022-04-15 MED ORDER — SODIUM CHLORIDE 0.9 % IV BOLUS
1000.0000 mL | Freq: Once | INTRAVENOUS | Status: AC
  Administered 2022-04-15: 1000 mL via INTRAVENOUS

## 2022-04-15 NOTE — TOC CM/SW Note (Signed)
Patient has orders to discharge home today. Chart reviewed. No TOC needs identified. CSW signing off.  Dayton Scrape, Ashley

## 2022-04-15 NOTE — Discharge Summary (Signed)
Physician Discharge Summary   Patient: Kelly Curry MRN: [id number] DOB: [birth year]  Admit date:     04/14/2022  Discharge date: 04/15/22  Discharge Physician: Lorella Nimrod   PCP: Jonetta Osgood, NP   Recommendations at discharge:  Please obtain CBC and BMP on next follow-up appointment Follow-up with primary care provider within a week Follow-up with pulmonology Follow-up with oncology  Discharge Diagnoses: Principal Problem:   Hyponatremia Active Problems:   Lung cancer ([place])   COPD (chronic obstructive pulmonary disease) [facility])   Hospital Course: Taken from H&P.  Kelly Curry is a 66 y.o. 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   [name] states that last week around 12/13, she developed nausea with nonbloody vomiting in addition to increasing shortness of breath. Per chart review, patient contacted her pulmonologist on 12/15 due to symptoms and she was prescribed a 7-day course of Levaquin and a prednisone taper.  Over the next few days, she states that her nausea and vomiting resolved and she was gradually increasing her p.o. intake.  On 12/18, she went to the ED for evaluation for shortness of breath but left prior to being evaluated.  Labs obtained at that time demonstrated a sodium of 116.  Yesterday, she was contacted and recommended to return to the ED immediately due to abnormal sodium.  At this time, she endorses slightly worsening shortness of breath and cough over the last 24 hours.  She endorses some generalized weakness that has been present for the last 7 days but denies any focal weakness, headache, vision changes, dizziness, altered mental status.  ED course.  On arrival to the ED, patient was normotensive at 118/85 with heart rate of 96. She was saturating 93% on room air. Initial workup remarkable for sodium of 125, potassium of 3.3, chloride of 86,  WBC of 8.3, hemoglobin of 11.8.  COVID-19, influenza and RSV PCR was negative on 12/18. EKG with sinus rhythm, borderline first-degree AV block, right axis deviation.  12/21: Vitals with elevated blood pressure at 166/90 and tachycardia at 110, saturating well on room air.  CBC with new leukocytosis at 10.8, hemoglobin stable at 11.6, sodium increased to 131.  Serum osmolality low at 264, normal urine osmolality at 380, urine sodium at 19, TSH normal at 1.998.  UA unremarkable;. Chest x-ray with no active disease, findings compatible with COPD and grossly irregular right upper lobe nodule which was better evaluated on recent PET/CT. Patient otherwise stable.  Sodium initially improved to 131 and after receiving little more fluid decreased to 130.  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  She was also started on salt tablets.  She would like to go home.  Per patient she was given oxygen to be used at 2 L but she was not using it very consistently.  She does have oxygen at home.  Patient was ambulated on room air but she never desaturated below 90%.  Continue to have some shortness of breath which seems chronic.  Patient had an upcoming appointment with [facility] pulmonology for lung biopsy.  She was advised to complete her home course of Levaquin and prednisone and follow-up with her providers for further recommendations.  She will continue on current medications and need to have a close follow-up.     Assessment and Plan: * Hyponatremia Patient presenting with hyponatremia of undetermined etiology.  On 12/18, sodium was 116, with improvement up to  125 today with no medical interventions.  Differential includes acute illness leading to mild dehydration versus SIADH in the setting of suspected underlying lung cancer of unknown etiology.  She has received 1 L fluids in the ED so far, will wait for further lab results and repeat BMP prior to administering additional fluids.   - Urine sodium  and creatinine pending - Serum osmolality pending - TSH pending - Repeat BMP this afternoon  Lung cancer [facility]) Patient recently underwent PET scan that showed hypermetabolic right upper lobe lesion concerning for primary bronchogenic carcinoma.  She has been referred for consultation for biopsy.  Patient is aware of this and understands.  COPD (chronic obstructive pulmonary disease) ([place]) Patient has a long-term history of COPD that has been gradually worsening.  She has been on Levaquin and a prednisone taper over the last 5 days due to exacerbation.  She continues to be short of breath today.  - Repeat chest x-ray - Continue scheduled DuoNebs every 6 hours - Continue Levaquin to complete 7-day course - Continue prednisone 40 mg daily to complete 7-day course - Continue home bronchodilators   Consultants: None Procedures performed: None Disposition: Home Diet recommendation:  Discharge Diet Orders (From admission, onward)     Start     Ordered   04/15/22 0000  Diet - low sodium heart healthy        04/15/22 1630           Regular diet DISCHARGE MEDICATION: Allergies as of 04/15/2022       Reactions   Hydrocodone Diarrhea, Nausea And Vomiting   Codeine Other (See Comments)   Reaction unknown   Ibuprofen Other (See Comments)   Reaction unknown        Medication List     TAKE these medications    acetaminophen 500 MG tablet Commonly known as: TYLENOL Take 500 mg by mouth every 4 (four) hours as needed.   albuterol (2.5 MG/3ML) 0.083% nebulizer solution Commonly known as: PROVENTIL Take 3 mLs (2.5 mg total) by nebulization every 4 (four) hours as needed for wheezing or shortness of breath.   APPLE CIDER VINEGAR PO Take by mouth.   atorvastatin 10 MG tablet Commonly known as: LIPITOR Take 1 tablet (10 mg total) by mouth daily.   Breztri Aerosphere 160-9-4.8 MCG/ACT Aero Generic drug: Budeson-Glycopyrrol-Formoterol Inhale 2 puffs into the lungs 2 (two)  times daily.   ibandronate 150 MG tablet Commonly known as: BONIVA Take 1 tablet (150 mg total) by mouth every 30 (thirty) days. Take in the morning with a full glass of water, on an empty stomach, and do not take anything else by mouth or lie down for the next 30 min.   levofloxacin 500 MG tablet Commonly known as: LEVAQUIN Take 1 tablet (500 mg total) by mouth daily for 7 days.   omeprazole 20 MG capsule Commonly known as: PRILOSEC Take 20 mg by mouth daily.   ondansetron 4 MG tablet Commonly known as: ZOFRAN Take 1 tablet (4 mg total) by mouth every 8 (eight) hours as needed for nausea or vomiting.   predniSONE 10 MG tablet Commonly known as: DELTASONE Take 1 tab po 3 x day for 3 days then take 1 tab po 2 x a day for 3 days and then take 1 tab po daily for 3 days   sodium chloride 1 g tablet Take 1 tablet (1 g total) by mouth 2 (two) times daily with a meal.   Spiriva Respimat 2.5 MCG/ACT Aers  Generic drug: Tiotropium Bromide Monohydrate Inhale 2 puffs into the lungs daily.        Follow-up Information     Jonetta Osgood, NP. Schedule an appointment as soon as possible for a visit in 1 week(s).   Specialty: Nurse Practitioner Contact information: [address] [place] [address] [phone]         Allyne Gee, MD. Schedule an appointment as soon as possible for a visit in 1 week(s).   Specialties: Internal Medicine, Pulmonary Disease Contact information: [place] [address] [phone]                Discharge Exam: There were no vitals filed for this visit. General.  Frail lady, in no acute distress. Pulmonary.  Lungs clear bilaterally, normal respiratory effort. CV.  Regular rate and rhythm, no JVD, rub or murmur. Abdomen.  Soft, nontender, nondistended, BS positive. CNS.  Alert and oriented .  No focal neurologic deficit. Extremities.  No edema, no cyanosis, pulses intact and symmetrical. Psychiatry.  Judgment  and insight appears normal.   Condition at discharge: stable  The results of significant diagnostics from this hospitalization (including imaging, microbiology, ancillary and laboratory) are listed below for reference.   Imaging Studies: DG Chest 1 View  Result Date: 04/14/2022 CLINICAL DATA:  Shortness of breath. EXAM: CHEST  1 VIEW COMPARISON:  PET-CT dated March 29, 2022. Chest x-ray dated November 10, 2021. FINDINGS: The heart size and mediastinal contours are within normal limits. Normal pulmonary vascularity. The lungs remain hyperinflated with emphysematous changes and coarse interstitial markings. Grossly irregular right upper lobe nodule, better evaluated on recent PET-CT. Unchanged left apical scarring and calcified granuloma in the superior segment of the left lower lobe. No focal consolidation, pleural effusion, or pneumothorax. No acute osseous abnormality. IMPRESSION: 1. No active disease.  COPD. 2. Grossly irregular right upper lobe nodule, better evaluated on recent PET-CT. Electronically Signed   By: Titus Dubin M.D.   On: 04/14/2022 14:13   NM PET Image Initial (PI) Skull Base To Thigh (F-18 FDG)  Result Date: 03/30/2022 CLINICAL DATA:  Initial treatment strategy for right upper lobe progressive lesion on chest CT. EXAM: NUCLEAR MEDICINE PET SKULL BASE TO THIGH TECHNIQUE: 6.5 mCi F-18 FDG was injected intravenously. Full-ring PET imaging was performed from the skull base to thigh after the radiotracer. CT data was obtained and used for attenuation correction and anatomic localization. Fasting blood glucose: 93 mg/dl COMPARISON:  Chest CT 03/12/2022 FINDINGS: Mediastinal blood pool activity: SUV max 2.5 Liver activity: SUV max NA NECK: No areas of abnormal hypermetabolism. Incidental CT findings: No cervical adenopathy. CHEST: No thoracic nodal hypermetabolism. The progressive right upper lobe lesion is hypermetabolic. 2.1 x 1.3 cm and a S.U.V. max of 7.4 on 63/2. Incidental CT  findings: Deferred to recent diagnostic CT. Aortic atherosclerosis. Moderate centrilobular emphysema. The left apical pleuroparenchymal scarring is not significantly hypermetabolic. Left lower lobe calcified granuloma. ABDOMEN/PELVIS: No abdominopelvic parenchymal or nodal hypermetabolism. Incidental CT findings: Normal adrenal glands. Abdominal aortic at the sclerosis. Probable hysterectomy. Pelvic floor laxity. SKELETON: No abnormal marrow activity. Incidental CT findings: Mild osteopenia. IMPRESSION: 1. Hypermetabolic right upper lobe pulmonary nodule, consistent with primary bronchogenic carcinoma. No evidence of thoracic nodal or extrathoracic hypermetabolic metastasis. T1cN0M0 or stage IA 2. Incidental findings, including: Aortic atherosclerosis (ICD10-I70.0), coronary artery atherosclerosis and emphysema (ICD10-J43.9). Electronically Signed   By: Abigail Miyamoto M.D.   On: 03/30/2022 11:30    Microbiology: Results for orders placed or performed  during the hospital encounter of 04/14/22  Resp panel by RT-PCR (RSV, Flu A&B, Covid) Anterior Nasal Swab     Status: None   Collection Time: 04/14/22 12:12 PM   Specimen: Anterior Nasal Swab  Result Value Ref Range Status   SARS Coronavirus 2 by RT PCR NEGATIVE NEGATIVE Final    Comment: (NOTE) SARS-CoV-2 target nucleic acids are NOT DETECTED.  The SARS-CoV-2 RNA is generally detectable in upper respiratory specimens during the acute phase of infection. The lowest concentration of SARS-CoV-2 viral copies this assay can detect is 138 copies/mL. A negative result does not preclude SARS-Cov-2 infection and should not be used as the sole basis for treatment or other patient management decisions. A negative result may occur with  improper specimen collection/handling, submission of specimen other than nasopharyngeal swab, presence of viral mutation(s) within the areas targeted by this assay, and inadequate number of viral copies(<138 copies/mL). A  negative result must be combined with clinical observations, patient history, and epidemiological information. The expected result is Negative.  Fact Sheet for Patients:  EntrepreneurPulse.com.au  Fact Sheet for Healthcare Providers:  IncredibleEmployment.be  This test is no t yet approved or cleared by the [place] FDA and  has been authorized for detection and/or diagnosis of SARS-CoV-2 by FDA under an Emergency Use Authorization (EUA). This EUA will remain  in effect (meaning this test can be used) for the duration of the COVID-19 declaration under Section 564(b)(1) of the Act, 21 U.S.C.section 360bbb-3(b)(1), unless the authorization is terminated  or revoked sooner.       Influenza A by PCR NEGATIVE NEGATIVE Final   Influenza B by PCR NEGATIVE NEGATIVE Final    Comment: (NOTE) The Xpert Xpress SARS-CoV-2/FLU/RSV plus assay is intended as an aid in the diagnosis of influenza from Nasopharyngeal swab specimens and should not be used as a sole basis for treatment. Nasal washings and aspirates are unacceptable for Xpert Xpress SARS-CoV-2/FLU/RSV testing.  Fact Sheet for Patients: EntrepreneurPulse.com.au  Fact Sheet for Healthcare Providers: IncredibleEmployment.be  This test is not yet approved or cleared by the [place] FDA and has been authorized for detection and/or diagnosis of SARS-CoV-2 by FDA under an Emergency Use Authorization (EUA). This EUA will remain in effect (meaning this test can be used) for the duration of the COVID-19 declaration under Section 564(b)(1) of the Act, 21 U.S.C. section 360bbb-3(b)(1), unless the authorization is terminated or revoked.     Resp Syncytial Virus by PCR NEGATIVE NEGATIVE Final    Comment: (NOTE) Fact Sheet for Patients: EntrepreneurPulse.com.au  Fact Sheet for Healthcare  Providers: IncredibleEmployment.be  This test is not yet approved or cleared by the [place] FDA and has been authorized for detection and/or diagnosis of SARS-CoV-2 by FDA under an Emergency Use Authorization (EUA). This EUA will remain in effect (meaning this test can be used) for the duration of the COVID-19 declaration under Section 564(b)(1) of the Act, 21 U.S.C. section 360bbb-3(b)(1), unless the authorization is terminated or revoked.  Performed at [facility], [address] [place], [place] [address]     Labs: CBC: Recent Labs  Lab 04/12/22 1912 04/14/22 1043 04/15/22 0646  WBC 5.7 8.3 10.8*  NEUTROABS 4.0  --   --   HGB 11.1* 11.8* 11.6*  HCT 32.3* 34.0* 34.0*  MCV 84.1 84.4 85.2  PLT 232 332 497   Basic Metabolic Panel: Recent Labs  Lab 04/12/22 1912 04/14/22 1043 04/14/22 1636 04/15/22 0646 04/15/22 1333  NA 116* 125* 129* 131* 130*  K 3.2* 3.3* 3.8 4.1 3.7  CL 80* 86* 92* 96* 98  CO2 27 26 25 27 25   GLUCOSE 114* 120* 121* 100* 149*  BUN 11 16 14 14 15   CREATININE 0.72 0.86 0.80 0.76 0.84  CALCIUM 7.9* 8.5* 8.1* 8.6* 8.1*  MG  --  2.2  --   --   --    Liver Function Tests: Recent Labs  Lab 04/12/22 1912  AST 47*  ALT 22  ALKPHOS 53  BILITOT 0.5  PROT 6.5  ALBUMIN 3.2*   CBG: No results for input(s): "GLUCAP" in the last 168 hours.  Discharge time spent: greater than 30 minutes.  This record has been created using Systems analyst. Errors have been sought and corrected,but may not always be located. Such creation errors do not reflect on the standard of care.   Signed: Lorella Nimrod, MD [facility] 04/15/2022

## 2022-04-15 NOTE — Progress Notes (Signed)
Pt discharged per MD order.  IV removed. Discharge instructions reviewed with pt. Pt verbalized understanding. All questions answered to pt satisfaction. Pt taken out in wheelchair by staff.

## 2022-04-15 NOTE — Care Management Obs Status (Signed)
[facility] NOTIFICATION   Patient Details  Name: Kelly Curry MRN: [id number] Date of Birth: 05-07-1955   Medicare Observation Status Notification Given:  Yes    Candie Chroman, LCSW 04/15/2022, 3:32 PM

## 2022-04-15 NOTE — Hospital Course (Addendum)
Taken from H&P.  Kelly Curry is a 66 y.o. 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   Kelly Curry states that last week around 12/13, she developed nausea with nonbloody vomiting in addition to increasing shortness of breath. Per chart review, patient contacted her pulmonologist on 12/15 due to symptoms and she was prescribed a 7-day course of Levaquin and a prednisone taper.  Over the next few days, she states that her nausea and vomiting resolved and she was gradually increasing her p.o. intake.  On 12/18, she went to the ED for evaluation for shortness of breath but left prior to being evaluated.  Labs obtained at that time demonstrated a sodium of 116.  Yesterday, she was contacted and recommended to return to the ED immediately due to abnormal sodium.  At this time, she endorses slightly worsening shortness of breath and cough over the last 24 hours.  She endorses some generalized weakness that has been present for the last 7 days but denies any focal weakness, headache, vision changes, dizziness, altered mental status.  ED course.  On arrival to the ED, patient was normotensive at 118/85 with heart rate of 96. She was saturating 93% on room air. Initial workup remarkable for sodium of 125, potassium of 3.3, chloride of 86, WBC of 8.3, hemoglobin of 11.8.  COVID-19, influenza and RSV PCR was negative on 12/18. EKG with sinus rhythm, borderline first-degree AV block, right axis deviation.  12/21: Vitals with elevated blood pressure at 166/90 and tachycardia at 110, saturating well on room air.  CBC with new leukocytosis at 10.8, hemoglobin stable at 11.6, sodium increased to 131.  Serum osmolality low at 264, normal urine osmolality at 380, urine sodium at 19, TSH normal at 1.998.  UA unremarkable;. Chest x-ray with no active disease, findings compatible with COPD and grossly irregular right  upper lobe nodule which was better evaluated on recent PET/CT. Patient otherwise stable.  Sodium initially improved to 131 and after receiving little more fluid decreased to 130.  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  She was also started on salt tablets.  She would like to go home.  Per patient she was given oxygen to be used at 2 L but she was not using it very consistently.  She does have oxygen at home.  Patient was ambulated on room air but she never desaturated below 90%.  Continue to have some shortness of breath which seems chronic.  Patient had an upcoming appointment with [facility] pulmonology for lung biopsy.  She was advised to complete her home course of Levaquin and prednisone and follow-up with her providers for further recommendations.  She will continue on current medications and need to have a close follow-up.

## 2022-04-15 NOTE — Progress Notes (Signed)
SATURATION QUALIFICATIONS: (This note is used to comply with regulatory documentation for home oxygen)  Patient Saturations on Room Air at Rest = 94%  Patient Saturations on Room Air while Ambulating = 91%

## 2022-04-16 ENCOUNTER — Other Ambulatory Visit: Payer: Self-pay

## 2022-04-16 ENCOUNTER — Telehealth: Payer: Self-pay

## 2022-04-16 MED ORDER — FLUCONAZOLE 150 MG PO TABS: 150.0000 mg | ORAL_TABLET | Freq: Every day | ORAL | 0 refills | Status: AC

## 2022-04-16 NOTE — Telephone Encounter (Signed)
Pt called that she need med for thrush as per alyssa send diflucan 150 mg 1 tab po daily for 5 days

## 2022-04-20 ENCOUNTER — Telehealth: Payer: Self-pay | Admitting: *Deleted

## 2022-04-20 NOTE — Telephone Encounter (Signed)
       Patient  visit on 04/11/2022  at [facility] was for treatment    Telephone encounter attempt : 1st   A HIPAA compliant voice message was left requesting a return call.  Instructed patient to call back at [phone].  [place] [phone] [address] [place] , [place] [address] Email : [email or web address]

## 2022-04-22 ENCOUNTER — Telehealth: Payer: Self-pay | Admitting: *Deleted

## 2022-04-22 NOTE — Telephone Encounter (Signed)
        Patient  visited [facility] on 04/11/2022  for LOW SODIUM   Telephone encounter attempt :  2ND  A HIPAA compliant voice message was left requesting a return call.  Instructed patient to call back at [phone].  [place] [phone] [address] , [place] [address] Email : [email or web address]

## 2022-04-26 DEATH — deceased

## 2022-05-06 ENCOUNTER — Ambulatory Visit: Payer: Medicare HMO | Admitting: Internal Medicine

## 2022-05-16 NOTE — Progress Notes (Signed)
Cough and viral symptoms concerning for covid or influenza

## 2022-05-19 ENCOUNTER — Institutional Professional Consult (permissible substitution): Payer: Medicare HMO | Admitting: Pulmonary Disease

## 2022-05-20 ENCOUNTER — Ambulatory Visit: Payer: Medicare HMO | Admitting: Nurse Practitioner

## 2022-08-12 ENCOUNTER — Ambulatory Visit: Payer: Medicare HMO | Admitting: Physician Assistant

## 2022-10-18 ENCOUNTER — Ambulatory Visit: Payer: Medicare HMO | Admitting: Nurse Practitioner
